# Patient Record
Sex: Female | Born: 1995 | Race: Black or African American | Hispanic: No | Marital: Married | Smoking: Never smoker
Health system: Southern US, Community
[De-identification: ages and names within clinical notes are randomized; demographics above are authoritative.]

## PROBLEM LIST (undated history)

## (undated) DIAGNOSIS — Z789 Other specified health status: Secondary | ICD-10-CM

---

## 2014-03-14 NOTE — L&D Delivery Note (Cosign Needed)
Delivery Note  Patient presented in labor and progressed without augmentation save for AROM when she was approximately 9 cm dilated.  Called to room as patient was completed dilated and pushing. During crowning, it was difficult to get good fetal heart readings. A fetal scalp electrode was placed and fetal HR was noted to be in the 50s. The patient was urged to push, and modified ritgen maneuver was utilized.   At 10:38 PM a viable female was delivered via Vaginal, Spontaneous Delivery (Presentation: ; Occiput Anterior).  APGAR: 8, 9; weight 3260 g.   Placenta status: Intact, Spontaneous.  Cord: 3 vessels with the following complications: None.  Cord pH: unable to obtain.  Complicated lacerations noted. Left through and through labial that was repaired with 4-0 vicryl. Bilateral sulcal and second degree perineal. The left sulcal laceration was noted to extend deeply into the vagina. Rectal exam performed noting no external anal sphincter or mucosal defect. The above were repaired with 3-0 vicryl. Vaginal packing placed with plan to remove in approximately 6-8 hours.  Anesthesia: Lidocaine, 2 doses of Fentanyl 100mcg Lacerations: see above Est. Blood Loss (mL):  400 mL  Mom to postpartum.  Baby to Couplet care / Skin to Skin.  Crystal Dorsey 02/01/2015, 12:12 AM Shonna ChockNoah Santiago Stenzel, MD OB/GYN Fellow

## 2014-07-15 ENCOUNTER — Other Ambulatory Visit (INDEPENDENT_AMBULATORY_CARE_PROVIDER_SITE_OTHER): Payer: Self-pay

## 2014-07-15 DIAGNOSIS — Z3481 Encounter for supervision of other normal pregnancy, first trimester: Secondary | ICD-10-CM

## 2014-07-15 NOTE — Progress Notes (Signed)
[facility] phlebotomist drew:  Prenatal panel, HIV, Sickle cell screen, OB urine culture 

## 2014-07-16 LAB — OBSTETRIC PANEL
Antibody Screen: NEGATIVE
BASOS ABS: 0 10*3/uL (ref 0.0–0.1)
Basophils Relative: 0 % (ref 0–1)
EOS ABS: 0 10*3/uL (ref 0.0–0.7)
EOS PCT: 1 % (ref 0–5)
HEMATOCRIT: 36.2 % (ref 36.0–46.0)
HEP B S AG: NEGATIVE
Hemoglobin: 12.1 g/dL (ref 12.0–15.0)
Lymphocytes Relative: 43 % (ref 12–46)
Lymphs Abs: 2 10*3/uL (ref 0.7–4.0)
MCH: 28 pg (ref 26.0–34.0)
MCHC: 33.4 g/dL (ref 30.0–36.0)
MCV: 83.8 fL (ref 78.0–100.0)
MONO ABS: 0.5 10*3/uL (ref 0.1–1.0)
MONOS PCT: 10 % (ref 3–12)
MPV: 10.3 fL (ref 8.6–12.4)
Neutro Abs: 2.2 10*3/uL (ref 1.7–7.7)
Neutrophils Relative %: 46 % (ref 43–77)
PLATELETS: 193 10*3/uL (ref 150–400)
RBC: 4.32 MIL/uL (ref 3.87–5.11)
RDW: 15 % (ref 11.5–15.5)
Rh Type: POSITIVE
Rubella: 5.95 Index — ABNORMAL HIGH (ref <0.90)
WBC: 4.7 10*3/uL (ref 4.0–10.5)

## 2014-07-16 LAB — HIV ANTIBODY (ROUTINE TESTING W REFLEX): HIV 1&2 Ab, 4th Generation: NONREACTIVE

## 2014-07-16 LAB — SICKLE CELL SCREEN: Sickle Cell Screen: NEGATIVE

## 2014-07-17 LAB — CULTURE, OB URINE
Colony Count: NO GROWTH
ORGANISM ID, BACTERIA: NO GROWTH

## 2014-07-22 ENCOUNTER — Encounter: Payer: Self-pay | Admitting: Family Medicine

## 2014-07-22 ENCOUNTER — Ambulatory Visit (INDEPENDENT_AMBULATORY_CARE_PROVIDER_SITE_OTHER): Payer: Self-pay | Admitting: Family Medicine

## 2014-07-22 VITALS — BP 98/66 | HR 92 | Temp 98.6°F | Ht 66.0 in | Wt 194.0 lb

## 2014-07-22 DIAGNOSIS — Z3401 Encounter for supervision of normal first pregnancy, first trimester: Secondary | ICD-10-CM

## 2014-07-22 DIAGNOSIS — Z3402 Encounter for supervision of normal first pregnancy, second trimester: Secondary | ICD-10-CM | POA: Insufficient documentation

## 2014-07-22 MED ORDER — PRENATAL 27-0.8 MG PO TABS: 1.0000 | ORAL_TABLET | Freq: Every day | ORAL | Status: DC

## 2014-07-22 NOTE — Progress Notes (Signed)
Wanda Matthews is a 19 y.o. yo G1P0 at [redacted]w[redacted]d who presents for her initial prenatal visit. Pregnancy  is notplanned, however the [name] and [name] appear excited She reports nausea and some food aversion. Able to tolerate PO She  isTaking PNV, however they are the vitafusion gummies with do not contain Fe. See flow sheet for details.  PMH, POBH, FH, meds, allergies and Social Hx reviewed. Specifically no PMHx, no meds or medication allergies  No alcohol, tobacco, or drug use Lives with sister, [name] is involved  No family h/o birth defects or DM  Prenatal exam:Gen: Well nourished, well developed.  No distress.  Vitals noted. Of note, pt's height is 5'6, making her BMI 31.3 HEENT: Normocephalic, atraumatic.  Neck supple without cervical lymphadenopathy, thyromegaly or thyroid nodules.  Good dentition. CV: RRR no murmur, gallops or rubs Lungs: CTA B.  Normal respiratory effort without wheezes or crackles. Abd: soft, NTND. +BS.  Uterus not appreciated above pelvis. GU: Normal external female genitalia without lesions.  Nl vaginal, well rugated without lesions. No vaginal discharge.  Bimanual exam: No adnexal mass or TTP. No CMT.   Ext: No clubbing, cyanosis or edema. Psych: Normal grooming and dress.  Not depressed or anxious appearing.  Normal thought content and process without flight of ideas or looseness of associations    Assessment/plan: 1) Pregnancy [redacted]w[redacted]d by certain LMP doing well.  Current pregnancy issues include obesity Dating is reliable Prenatal labs reviewed and unremarkable. Bleeding and pain precautions reviewed. Importance of prenatal vitamins reviewed (Rx for prenatal vitamins with Fe provided) Genetic screening offered: will get Quad screen at next appt Early glucola is indicated.  [facility]    Follow up 4 weeks. This appt was precepted with Dr. Mauricio PoBreen

## 2014-07-22 NOTE — Patient Instructions (Signed)
It was nice to meet you, Congratulations! Please make an appointment with Britta Mccreedy, our financial advisor, when you leave Please follow up with me in 1 month (at that time we will do a 1 hour glucose test to look for diabetes).  Medicines During Pregnancy During pregnancy, there are medicines that are either safe or unsafe to take. Medicines include prescriptions from your caregiver, over-the-counter medicines, topical creams applied to the skin, and all herbal substances. Medicines are put into either Class A, B, C, or D. Class A and B medicines have been shown to be safe in pregnancy. Class C medicines are also considered to be safe in pregnancy, but these medicines should only be used when necessary. Class D medicines should not be used at all in pregnancy. They can be harmful to a baby.  It is best to take as little medicine as possible while pregnant. However, some medicines are necessary to take for the mother and baby's health. Sometimes, it is more dangerous to stop taking certain medicines than to stay on them. This is often the case for people with long-term (chronic) conditions such as asthma, diabetes, or high blood pressure (hypertension). If you are pregnant and have a chronic illness, call your caregiver right away. Bring a list of your medicines and their doses to your appointments. If you are planning to become pregnant, schedule a doctor's appointment and discuss your medicines with your caregiver. Lastly, write down the phone number to your pharmacist. They can answer questions regarding a medicine's class and safety. They cannot give advice as to whether you should or should not be on a medicine.  SAFE AND UNSAFE MEDICINES There is a long list of medicines that are considered safe in pregnancy. Below is a shorter list. For specific medicines, ask your caregiver.  AllergyMedicines Loratadine, cetirizine, and chlorpheniramine are safe to take. Certain nasal steroid sprays are safe. Talk  to your caregiver about specific brands that are safe. Analgesics Acetaminophen and acetaminophen with codeine are safe to take. All other nonsteroidal anti-inflammatory drugs (NSAIDS) are not safe. This includes ibuprofen.  Antacids Many over-the-counter antacids are safe to take. Talk to your caregiver about specific brands that are safe. Famotidine, ranitidine, and lansoprazole are safe. Omepresole is considered safe to take in the second trimester. Antibiotic Medicines There are several antibiotics to avoid. These include, but are not limited to, tetracyline, quinolones, and sulfa medications. Talk to your caregiver before taking any antibiotic.  Antihistamines Talk to your caregiver about specific brands that are safe.  Asthma Medicines Most asthma steroid inhalers are safe to take. Talk to your caregiver for specific details. Calcium Calcium supplements are safe to take. Do not take oyster shell calcium.  Cough and Cold Medicines It is safe to take products with guaifenesin or dextromethorphan. Talk to your caregiver about specific brands that are safe. It is not safe to take products that contain aspirin or ibuprofen. Decongestant Medicines Pseudoephedrine-containing products are safe to take in the second and third trimester.  Depression Medicines Talk about these medicines with your caregiver.  Antidiarrheal Medicines It is safe to take loperamide. Talk to your caregiver about specific brands that are safe. It is not safe to take any antidiarrheal medicine that contains bismuth. Eyedrops Allergy eyedrops should be limited.  Iron It is safe to use certain iron-containing medicines for anemia in pregnancy. They require a prescription.  Antinausea Medicines It is safe to take doxylamine and vitamin B6 as directed. There are other prescription medicines available, if  needed.  Sleep aids It is safe to take diphenhydramine and acetaminophen with diphenhydramine.   Steroids Hydrocortisone creams are safe to use as directed. Oral steroids require a prescription. It is not safe to take any hemorrhoid cream with pramoxine or phenylephrine. Stool softener It is safe to take stool softener medicines. Avoid daily or prolonged use of stool softeners. Thyroid Medicine It is important to stay on this thyroid medicine. It needs to be followed by your caregiver.  Vaginal Medicines Your caregiver will prescribe a medicine to you if you have a vaginal infection. Certain antifungal medicines are safe to use if you have a sexually transmitted infection (STI). Talk to your caregiver.  Document Released: 02/28/2005 Document Revised: 05/23/2011 Document Reviewed: 03/01/2011 [facility] Patient Information 2015 [place], [place]. This information is not intended to replace advice given to you by your health care provider. Make sure you discuss any questions you have with your health care provider.  Second Trimester of Pregnancy The second trimester is from week 13 through week 28, month 4 through 6. This is often the time in pregnancy that you feel your best. Often times, morning sickness has lessened or quit. You may have more energy, and you may get hungry more often. Your unborn baby (fetus) is growing rapidly. At the end of the sixth month, he or she is about 9 inches long and weighs about 1 pounds. You will likely feel the baby move (quickening) between 18 and 20 weeks of pregnancy. HOME CARE   Avoid all smoking, herbs, and alcohol. Avoid drugs not approved by your doctor.  Only take medicine as told by your doctor. Some medicines are safe and some are not during pregnancy.  Exercise only as told by your doctor. Stop exercising if you start having cramps.  Eat regular, healthy meals.  Wear a good support bra if your breasts are tender.  Do not use hot tubs, steam rooms, or saunas.  Wear your seat belt when driving.  Avoid raw meat, uncooked cheese, and liter  boxes and soil used by cats.  Take your prenatal vitamins.  Try taking medicine that helps you poop (stool softener) as needed, and if your doctor approves. Eat more fiber by eating fresh fruit, vegetables, and whole grains. Drink enough fluids to keep your pee (urine) clear or pale yellow.  Take warm water baths (sitz baths) to soothe pain or discomfort caused by hemorrhoids. Use hemorrhoid cream if your doctor approves.  If you have puffy, bulging veins (varicose veins), wear support hose. Raise (elevate) your feet for 15 minutes, 3-4 times a day. Limit salt in your diet.  Avoid heavy lifting, wear low heals, and sit up straight.  Rest with your legs raised if you have leg cramps or low back pain.  Visit your dentist if you have not gone during your pregnancy. Use a soft toothbrush to brush your teeth. Be gentle when you floss.  You can have sex (intercourse) unless your doctor tells you not to.  Go to your doctor visits. GET HELP IF:   You feel dizzy.  You have mild cramps or pressure in your lower belly (abdomen).  You have a nagging pain in your belly area.  You continue to feel sick to your stomach (nauseous), throw up (vomit), or have watery poop (diarrhea).  You have bad smelling fluid coming from your vagina.  You have pain with peeing (urination). GET HELP RIGHT AWAY IF:   You have a fever.  You are leaking fluid from  your vagina.  You have spotting or bleeding from your vagina.  You have severe belly cramping or pain.  You lose or gain weight rapidly.  You have trouble catching your breath and have chest pain.  You notice sudden or extreme puffiness (swelling) of your face, hands, ankles, feet, or legs.  You have not felt the baby move in over an hour.  You have severe headaches that do not go away with medicine.  You have vision changes. Document Released: 05/25/2009 Document Revised: 06/25/2012 Document Reviewed: 05/01/2012 [facility] Patient  Information 2015 [place], [place]. This information is not intended to replace advice given to you by your health care provider. Make sure you discuss any questions you have with your health care provider.

## 2014-07-22 NOTE — Progress Notes (Signed)
I was preceptor the day of this visit.   

## 2014-07-23 ENCOUNTER — Encounter: Payer: Self-pay | Admitting: Family Medicine

## 2014-07-23 LAB — CERVICOVAGINAL ANCILLARY ONLY
Chlamydia: NEGATIVE
NEISSERIA GONORRHEA: NEGATIVE

## 2014-08-28 ENCOUNTER — Ambulatory Visit (INDEPENDENT_AMBULATORY_CARE_PROVIDER_SITE_OTHER): Payer: Self-pay | Admitting: Family Medicine

## 2014-08-28 VITALS — BP 110/61 | HR 98 | Temp 98.2°F | Wt 188.3 lb

## 2014-08-28 DIAGNOSIS — R7302 Impaired glucose tolerance (oral): Secondary | ICD-10-CM

## 2014-08-28 DIAGNOSIS — R7309 Other abnormal glucose: Secondary | ICD-10-CM

## 2014-08-28 DIAGNOSIS — Z3402 Encounter for supervision of normal first pregnancy, second trimester: Secondary | ICD-10-CM

## 2014-08-28 LAB — GLUCOSE, CAPILLARY: GLUCOSE-CAPILLARY: 146 mg/dL — AB (ref 65–99)

## 2014-08-28 NOTE — Assessment & Plan Note (Signed)
Patient to return for 3hr GTT.

## 2014-08-28 NOTE — Progress Notes (Signed)
Wanda Matthews is a 19 y.o. G1P0 at [redacted]w[redacted]d for routine follow up.  Wanda Matthews reports no complaints See flow sheet for details.  A/P: Pregnancy at [redacted]w[redacted]d.  Doing well.   Pregnancy issues include obesity Anatomy ultrasound ordered to be scheduled at 18-19 weeks with Dr. Gaynell Face (discussed up front fee with pt) Pt  is interested in genetic screening. Quad screen today/ Bleeding and pain precautions reviewed. Follow up 4 weeks.

## 2014-08-28 NOTE — Patient Instructions (Signed)
Second Trimester of Pregnancy The second trimester is from week 13 through week 28, months 4 through 6. The second trimester is often a time when you feel your best. Your body has also adjusted to being pregnant, and you begin to feel better physically. Usually, morning sickness has lessened or quit completely, you may have more energy, and you may have an increase in appetite. The second trimester is also a time when the fetus is growing rapidly. At the end of the sixth month, the fetus is about 9 inches long and weighs about 1 pounds. You will likely begin to feel the baby move (quickening) between 18 and 20 weeks of the pregnancy. BODY CHANGES Your body goes through many changes during pregnancy. The changes vary from woman to woman.   Your weight will continue to increase. You will notice your lower abdomen bulging out.  You may begin to get stretch marks on your hips, abdomen, and breasts.  You may develop headaches that can be relieved by medicines approved by your health care provider.  You may urinate more often because the fetus is pressing on your bladder.  You may develop or continue to have heartburn as a result of your pregnancy.  You may develop constipation because certain hormones are causing the muscles that push waste through your intestines to slow down.  You may develop hemorrhoids or swollen, bulging veins (varicose veins).  You may have back pain because of the weight gain and pregnancy hormones relaxing your joints between the bones in your pelvis and as a result of a shift in weight and the muscles that support your balance.  Your breasts will continue to grow and be tender.  Your gums may bleed and may be sensitive to brushing and flossing.  Dark spots or blotches (chloasma, mask of pregnancy) may develop on your face. This will likely fade after the baby is born.  A dark line from your belly button to the pubic area (linea nigra) may appear. This will likely fade  after the baby is born.  You may have changes in your hair. These can include thickening of your hair, rapid growth, and changes in texture. Some women also have hair loss during or after pregnancy, or hair that feels dry or thin. Your hair will most likely return to normal after your baby is born. WHAT TO EXPECT AT YOUR PRENATAL VISITS During a routine prenatal visit:  You will be weighed to make sure you and the fetus are growing normally.  Your blood pressure will be taken.  Your abdomen will be measured to track your baby's growth.  The fetal heartbeat will be listened to.  Any test results from the previous visit will be discussed. Your health care provider may ask you:  How you are feeling.  If you are feeling the baby move.  If you have had any abnormal symptoms, such as leaking fluid, bleeding, severe headaches, or abdominal cramping.  If you have any questions. Other tests that may be performed during your second trimester include:  Blood tests that check for:  Low iron levels (anemia).  Gestational diabetes (between 24 and 28 weeks).  Rh antibodies.  Urine tests to check for infections, diabetes, or protein in the urine.  An ultrasound to confirm the proper growth and development of the baby.  An amniocentesis to check for possible genetic problems.  Fetal screens for spina bifida and Down syndrome. HOME CARE INSTRUCTIONS   Avoid all smoking, herbs, alcohol, and unprescribed   drugs. These chemicals affect the formation and growth of the baby.  Follow your health care provider's instructions regarding medicine use. There are medicines that are either safe or unsafe to take during pregnancy.  Exercise only as directed by your health care provider. Experiencing uterine cramps is a good sign to stop exercising.  Continue to eat regular, healthy meals.  Wear a good support bra for breast tenderness.  Do not use hot tubs, steam rooms, or saunas.  Wear your  seat belt at all times when driving.  Avoid raw meat, uncooked cheese, cat litter boxes, and soil used by cats. These carry germs that can cause birth defects in the baby.  Take your prenatal vitamins.  Try taking a stool softener (if your health care provider approves) if you develop constipation. Eat more high-fiber foods, such as fresh vegetables or fruit and whole grains. Drink plenty of fluids to keep your urine clear or pale yellow.  Take warm sitz baths to soothe any pain or discomfort caused by hemorrhoids. Use hemorrhoid cream if your health care provider approves.  If you develop varicose veins, wear support hose. Elevate your feet for 15 minutes, 3-4 times a day. Limit salt in your diet.  Avoid heavy lifting, wear low heel shoes, and practice good posture.  Rest with your legs elevated if you have leg cramps or low back pain.  Visit your dentist if you have not gone yet during your pregnancy. Use a soft toothbrush to brush your teeth and be gentle when you floss.  A sexual relationship may be continued unless your health care provider directs you otherwise.  Continue to go to all your prenatal visits as directed by your health care provider. SEEK MEDICAL CARE IF:   You have dizziness.  You have mild pelvic cramps, pelvic pressure, or nagging pain in the abdominal area.  You have persistent nausea, vomiting, or diarrhea.  You have a bad smelling vaginal discharge.  You have pain with urination. SEEK IMMEDIATE MEDICAL CARE IF:   You have a fever.  You are leaking fluid from your vagina.  You have spotting or bleeding from your vagina.  You have severe abdominal cramping or pain.  You have rapid weight gain or loss.  You have shortness of breath with chest pain.  You notice sudden or extreme swelling of your face, hands, ankles, feet, or legs.  You have not felt your baby move in over an hour.  You have severe headaches that do not go away with  medicine.  You have vision changes. Document Released: 02/22/2001 Document Revised: 03/05/2013 Document Reviewed: 05/01/2012 Exit[employer] Patient Information 2015 [employer], [employer]. This information is not intended to replace advice given to you by your health care provider. Make sure you discuss any questions you have with your health care provider.  

## 2014-08-29 ENCOUNTER — Telehealth: Payer: Self-pay | Admitting: Family Medicine

## 2014-08-29 NOTE — Telephone Encounter (Signed)
Attempted to call patient twice. No answer & no VM. Sending a letter with ultrasound appt info included.   6/24 @ 10:30am at Dr. Gaynell Face office.

## 2014-09-01 ENCOUNTER — Other Ambulatory Visit: Payer: Self-pay | Admitting: *Deleted

## 2014-09-01 DIAGNOSIS — Z3402 Encounter for supervision of normal first pregnancy, second trimester: Secondary | ICD-10-CM

## 2014-09-02 ENCOUNTER — Other Ambulatory Visit: Payer: Self-pay

## 2014-09-04 ENCOUNTER — Other Ambulatory Visit: Payer: Self-pay

## 2014-09-04 NOTE — Progress Notes (Signed)
AFP drawn today. Will be picked up tomorrow and performed at [facility].[place], Rodena Medin

## 2014-09-09 ENCOUNTER — Telehealth: Payer: Self-pay | Admitting: *Deleted

## 2014-09-09 ENCOUNTER — Other Ambulatory Visit: Payer: Self-pay

## 2014-09-09 DIAGNOSIS — R7309 Other abnormal glucose: Secondary | ICD-10-CM

## 2014-09-09 NOTE — Telephone Encounter (Signed)
Called patient x2, line rings then goes to busy signal. Will try again later.

## 2014-09-09 NOTE — Telephone Encounter (Signed)
Patient requesting to speak with MD, states her stomach will not tolerate and is refusing 3 hour GTT. Will forward to PCP.

## 2014-09-09 NOTE — Telephone Encounter (Signed)
-----   Message from Joanna Puffrystal S Dorsey, MD sent at 09/08/2014 10:00 AM EDT ----- Please call the patient and have her schedule a 3hr GTT appt in our clinic. Since she vomited during her last one we need to repeat it. If she can't doing it our clinic, [facility].   Thanks, Joanna Puffrystal S. Dorsey, MD [facility] Resident  09/08/2014, 10:01 AM

## 2014-09-11 NOTE — Telephone Encounter (Signed)
Patient refusing to do 3 hr GTT. States she believes her sugar was high at last visit due to eating heavily. Amenable to repeat 1h GTT. Will come into clinic for this test (anytime before 3:45pm).   Wanda Puffrystal S. Versa Craton, MD [facility] Resident  09/11/2014, 11:48 AM

## 2014-09-23 ENCOUNTER — Ambulatory Visit: Payer: Self-pay

## 2014-09-23 ENCOUNTER — Encounter: Payer: Self-pay | Admitting: *Deleted

## 2014-10-09 ENCOUNTER — Ambulatory Visit (INDEPENDENT_AMBULATORY_CARE_PROVIDER_SITE_OTHER): Payer: Self-pay | Admitting: Family Medicine

## 2014-10-09 VITALS — BP 102/63 | HR 88 | Temp 98.1°F | Wt 188.0 lb

## 2014-10-09 DIAGNOSIS — Z3402 Encounter for supervision of normal first pregnancy, second trimester: Secondary | ICD-10-CM

## 2014-10-09 LAB — GLUCOSE, CAPILLARY: Glucose-Capillary: 116 mg/dL — ABNORMAL HIGH (ref 65–99)

## 2014-10-09 NOTE — Patient Instructions (Signed)
Follow up in 1 month for routine OB care with Dr Leonides Schanz.  Preterm Labor Information Preterm labor is when labor starts before you are [redacted] weeks pregnant. The normal length of pregnancy is 39 to 41 weeks.  CAUSES  The cause of preterm labor is not often known. The most common known cause is infection. RISK FACTORS  Having a history of preterm labor.  Having your water break before it should.  Having a placenta that covers the opening of the cervix.  Having a placenta that breaks away from the uterus.  Having a cervix that is too weak to hold the baby in the uterus.  Having too much fluid in the amniotic sac.  Taking drugs or smoking while pregnant.  Not gaining enough weight while pregnant.  Being younger than 73 and older than 20 years old.  Having a low income.  Being African American. SYMPTOMS  Period-like cramps, belly (abdominal) pain, or back pain.  Contractions that are regular, as often as six in an hour. They may be mild or painful.  Contractions that start at the top of the belly. They then move to the lower belly and back.  Lower belly pressure that seems to get stronger.  Bleeding from the vagina.  Fluid leaking from the vagina. TREATMENT  Treatment depends on:  Your condition.  The condition of your baby.  How many weeks pregnant you are. Your doctor may have you:  Take medicine to stop contractions.  Stay in bed except to use the restroom (bed rest).  Stay in the hospital. WHAT SHOULD YOU DO IF YOU THINK YOU ARE IN PRETERM LABOR? Call your doctor right away. You need to go to the hospital right away.  HOW CAN YOU PREVENT PRETERM LABOR IN FUTURE PREGNANCIES?  Stop smoking, if you smoke.  Maintain healthy weight gain.  Do not take drugs or be around chemicals that are not needed.  Tell your doctor if you think you have an infection.  Tell your doctor if you had a preterm labor before. Document Released: 05/27/2008 Document Revised:  12/19/2012 Document Reviewed: 05/27/2008 [facility] Patient Information 2015 [place], [place]. This information is not intended to replace advice given to you by your health care provider. Make sure you discuss any questions you have with your health care provider. Second Trimester of Pregnancy The second trimester is from week 13 through week 28, months 4 through 6. The second trimester is often a time when you feel your best. Your body has also adjusted to being pregnant, and you begin to feel better physically. Usually, morning sickness has lessened or quit completely, you may have more energy, and you may have an increase in appetite. The second trimester is also a time when the fetus is growing rapidly. At the end of the sixth month, the fetus is about 9 inches long and weighs about 1 pounds. You will likely begin to feel the baby move (quickening) between 18 and 20 weeks of the pregnancy. BODY CHANGES Your body goes through many changes during pregnancy. The changes vary from woman to woman.   Your weight will continue to increase. You will notice your lower abdomen bulging out.  You may begin to get stretch marks on your hips, abdomen, and breasts.  You may develop headaches that can be relieved by medicines approved by your health care provider.  You may urinate more often because the fetus is pressing on your bladder.  You may develop or continue to have heartburn as a  result of your pregnancy.  You may develop constipation because certain hormones are causing the muscles that push waste through your intestines to slow down.  You may develop hemorrhoids or swollen, bulging veins (varicose veins).  You may have back pain because of the weight gain and pregnancy hormones relaxing your joints between the bones in your pelvis and as a result of a shift in weight and the muscles that support your balance.  Your breasts will continue to grow and be tender.  Your gums may bleed and may be  sensitive to brushing and flossing.  Dark spots or blotches (chloasma, mask of pregnancy) may develop on your face. This will likely fade after the baby is born.  A dark line from your belly button to the pubic area (linea nigra) may appear. This will likely fade after the baby is born.  You may have changes in your hair. These can include thickening of your hair, rapid growth, and changes in texture. Some women also have hair loss during or after pregnancy, or hair that feels dry or thin. Your hair will most likely return to normal after your baby is born. WHAT TO EXPECT AT YOUR PRENATAL VISITS During a routine prenatal visit:  You will be weighed to make sure you and the fetus are growing normally.  Your blood pressure will be taken.  Your abdomen will be measured to track your baby's growth.  The fetal heartbeat will be listened to.  Any test results from the previous visit will be discussed. Your health care provider may ask you:  How you are feeling.  If you are feeling the baby move.  If you have had any abnormal symptoms, such as leaking fluid, bleeding, severe headaches, or abdominal cramping.  If you have any questions. Other tests that may be performed during your second trimester include:  Blood tests that check for:  Low iron levels (anemia).  Gestational diabetes (between 24 and 28 weeks).  Rh antibodies.  Urine tests to check for infections, diabetes, or protein in the urine.  An ultrasound to confirm the proper growth and development of the baby.  An amniocentesis to check for possible genetic problems.  Fetal screens for spina bifida and Down syndrome. HOME CARE INSTRUCTIONS   Avoid all smoking, herbs, alcohol, and unprescribed drugs. These chemicals affect the formation and growth of the baby.  Follow your health care provider's instructions regarding medicine use. There are medicines that are either safe or unsafe to take during  pregnancy.  Exercise only as directed by your health care provider. Experiencing uterine cramps is a good sign to stop exercising.  Continue to eat regular, healthy meals.  Wear a good support bra for breast tenderness.  Do not use hot tubs, steam rooms, or saunas.  Wear your seat belt at all times when driving.  Avoid raw meat, uncooked cheese, cat litter boxes, and soil used by cats. These carry germs that can cause birth defects in the baby.  Take your prenatal vitamins.  Try taking a stool softener (if your health care provider approves) if you develop constipation. Eat more high-fiber foods, such as fresh vegetables or fruit and whole grains. Drink plenty of fluids to keep your urine clear or pale yellow.  Take warm sitz baths to soothe any pain or discomfort caused by hemorrhoids. Use hemorrhoid cream if your health care provider approves.  If you develop varicose veins, wear support hose. Elevate your feet for 15 minutes, 3-4 times a day. Limit  salt in your diet.  Avoid heavy lifting, wear low heel shoes, and practice good posture.  Rest with your legs elevated if you have leg cramps or low back pain.  Visit your dentist if you have not gone yet during your pregnancy. Use a soft toothbrush to brush your teeth and be gentle when you floss.  A sexual relationship may be continued unless your health care provider directs you otherwise.  Continue to go to all your prenatal visits as directed by your health care provider. SEEK MEDICAL CARE IF:   You have dizziness.  You have mild pelvic cramps, pelvic pressure, or nagging pain in the abdominal area.  You have persistent nausea, vomiting, or diarrhea.  You have a bad smelling vaginal discharge.  You have pain with urination. SEEK IMMEDIATE MEDICAL CARE IF:   You have a fever.  You are leaking fluid from your vagina.  You have spotting or bleeding from your vagina.  You have severe abdominal cramping or pain.  You  have rapid weight gain or loss.  You have shortness of breath with chest pain.  You notice sudden or extreme swelling of your face, hands, ankles, feet, or legs.  You have not felt your baby move in over an hour.  You have severe headaches that do not go away with medicine.  You have vision changes. Document Released: 02/22/2001 Document Revised: 03/05/2013 Document Reviewed: 05/01/2012 [facility] Patient Information 2015 [place], [place]. This information is not intended to replace advice given to you by your health care provider. Make sure you discuss any questions you have with your health care provider.

## 2014-10-09 NOTE — Progress Notes (Signed)
Wanda Matthews is a 20 y.o. G1P0 at [redacted]w[redacted]d for routine follow up.  She reports that she is doing well.  She did get u/s done with Dr Gaynell Face.  Everything was normal but notes baby was measuring 1 week ahead, but was told this will not affect due date.  She denies LOF, vaginal bleeding, abnormal vaginal discharge, nausea, vomiting, edema.  +FM.  She is eating and drinking sufficiently.  See flow sheet for details.  A/P: Pregnancy at [redacted]w[redacted]d.  Doing well.   Pregnancy issues include obesity and  failed 1 hour GTT.  This was repeated today.  Will follow up results. Quad screen is NEGATIVE.  See Media tab for results. Anatomy scan obtained per patient w/ Dr Gaynell Face.  Will call today to obtain these results.  Plan to discuss at next appointment Continue PNV. Preterm labor precautions reviewed.  Discussed availability of MAU if needed. Follow up 4 weeks.

## 2014-10-10 ENCOUNTER — Telehealth: Payer: Self-pay | Admitting: Family Medicine

## 2014-10-10 NOTE — Telephone Encounter (Signed)
Calling to find out if we received an u/s that was faxed three times yesterday, please call back to confirm, she is also putting a copy in the mail just in case

## 2014-10-13 NOTE — Telephone Encounter (Signed)
Called to let pt know we have U/S. Everything looks normal. No answer. If pt calls back please let her know.

## 2014-10-28 ENCOUNTER — Encounter: Payer: Self-pay | Admitting: Family Medicine

## 2014-11-06 ENCOUNTER — Encounter: Payer: Self-pay | Admitting: Family Medicine

## 2014-11-10 ENCOUNTER — Ambulatory Visit (INDEPENDENT_AMBULATORY_CARE_PROVIDER_SITE_OTHER): Payer: Medicaid Other | Admitting: Family Medicine

## 2014-11-10 VITALS — BP 112/62 | HR 104 | Temp 98.3°F | Wt 195.1 lb

## 2014-11-10 DIAGNOSIS — Z23 Encounter for immunization: Secondary | ICD-10-CM

## 2014-11-10 DIAGNOSIS — Z3402 Encounter for supervision of normal first pregnancy, second trimester: Secondary | ICD-10-CM

## 2014-11-10 LAB — CBC WITH DIFFERENTIAL/PLATELET
BASOS ABS: 0 10*3/uL (ref 0.0–0.1)
Basophils Relative: 0 % (ref 0–1)
EOS PCT: 1 % (ref 0–5)
Eosinophils Absolute: 0.1 10*3/uL (ref 0.0–0.7)
HEMATOCRIT: 36.6 % (ref 36.0–46.0)
Hemoglobin: 12.4 g/dL (ref 12.0–15.0)
LYMPHS ABS: 2 10*3/uL (ref 0.7–4.0)
LYMPHS PCT: 31 % (ref 12–46)
MCH: 30.2 pg (ref 26.0–34.0)
MCHC: 33.9 g/dL (ref 30.0–36.0)
MCV: 89.3 fL (ref 78.0–100.0)
MPV: 11.3 fL (ref 8.6–12.4)
Monocytes Absolute: 0.7 10*3/uL (ref 0.1–1.0)
Monocytes Relative: 10 % (ref 3–12)
NEUTROS PCT: 58 % (ref 43–77)
Neutro Abs: 3.8 10*3/uL (ref 1.7–7.7)
Platelets: 197 10*3/uL (ref 150–400)
RBC: 4.1 MIL/uL (ref 3.87–5.11)
RDW: 14.7 % (ref 11.5–15.5)
WBC: 6.6 10*3/uL (ref 4.0–10.5)

## 2014-11-10 NOTE — Progress Notes (Signed)
Wanda Matthews is a 19 y.o. G1P0 at [redacted]w[redacted]d for routine follow up.  She reports no LOF, vaginal bleeding, contractions. Does note some round ligament pain that comes on with prolonged standing and improves with rest.  See flow sheet for details.  A/P: Pregnancy at [redacted]w[redacted]d.  Doing well.   Pregnancy issues include   Infant feeding choice: breastfeeding with formula supplementation as needed  Contraception choice: Unsure, considering nexplanon  Infant circumcision desired not applicable  Tdapwas given today. 1 hour glucola, CBC, RPR, and HIV were done today.   Pregnancy medical home forms were not done today and reviewed.   RH status was reviewed and pt does not need Rhogam.  Rhogam was not given today.   Childbirth and education classes were offered. Preterm labor precautions reviewed. Kick counts reviewed. Follow up 2 weeks.

## 2014-11-10 NOTE — Patient Instructions (Addendum)
Braxton Hicks Contractions Contractions of the uterus can occur throughout pregnancy. Contractions are not always a sign that you are in labor.  WHAT ARE BRAXTON HICKS CONTRACTIONS?  Contractions that occur before labor are called Braxton Hicks contractions, or false labor. Toward the end of pregnancy (32-34 weeks), these contractions can develop more often and may become more forceful. This is not true labor because these contractions do not result in opening (dilatation) and thinning of the cervix. They are sometimes difficult to tell apart from true labor because these contractions can be forceful and people have different pain tolerances. You should not feel embarrassed if you go to the hospital with false labor. Sometimes, the only way to tell if you are in true labor is for your health care provider to look for changes in the cervix. If there are no prenatal problems or other health problems associated with the pregnancy, it is completely safe to be sent home with false labor and await the onset of true labor. HOW CAN YOU TELL THE DIFFERENCE BETWEEN TRUE AND FALSE LABOR? False Labor  The contractions of false labor are usually shorter and not as hard as those of true labor.   The contractions are usually irregular.   The contractions are often felt in the front of the lower abdomen and in the groin.   The contractions may go away when you walk around or change positions while lying down.   The contractions get weaker and are shorter lasting as time goes on.   The contractions do not usually become progressively stronger, regular, and closer together as with true labor.  True Labor  Contractions in true labor last 30-70 seconds, become very regular, usually become more intense, and increase in frequency.   The contractions do not go away with walking.   The discomfort is usually felt in the top of the uterus and spreads to the lower abdomen and low back.   True labor can be  determined by your health care provider with an exam. This will show that the cervix is dilating and getting thinner.  WHAT TO REMEMBER  Keep up with your usual exercises and follow other instructions given by your health care provider.   Take medicines as directed by your health care provider.   Keep your regular prenatal appointments.   Eat and drink lightly if you think you are going into labor.   If Braxton Hicks contractions are making you uncomfortable:   Change your position from lying down or resting to walking, or from walking to resting.   Sit and rest in a tub of warm water.   Drink 2-3 glasses of water. Dehydration may cause these contractions.   Do slow and deep breathing several times an hour.  WHEN SHOULD I SEEK IMMEDIATE MEDICAL CARE? Seek immediate medical care if:  Your contractions become stronger, more regular, and closer together.   You have fluid leaking or gushing from your vagina.   You have a fever.   You pass blood-tinged mucus.   You have vaginal bleeding.   You have continuous abdominal pain.   You have low back pain that you never had before.   You feel your baby's head pushing down and causing pelvic pressure.   Your baby is not moving as much as it used to.  Document Released: 02/28/2005 Document Revised: 03/05/2013 Document Reviewed: 12/10/2012 [employer] Patient Information 2015 [employer]. This information is not intended to replace advice given to you by your health care   provider. Make sure you discuss any questions you have with your health care provider. °Third Trimester of Pregnancy °The third trimester is from week 29 through week 42, months 7 through 9. The third trimester is a time when the fetus is growing rapidly. At the end of the ninth month, the fetus is about 20 inches in length and weighs 6-10 pounds.  °BODY CHANGES °Your body goes through many changes during pregnancy. The changes vary from woman to woman.   °· Your weight will continue to increase. You can expect to gain 25-35 pounds (11-16 kg) by the end of the pregnancy. °· You may begin to get stretch marks on your hips, abdomen, and breasts. °· You may urinate more often because the fetus is moving lower into your pelvis and pressing on your bladder. °· You may develop or continue to have heartburn as a result of your pregnancy. °· You may develop constipation because certain hormones are causing the muscles that push waste through your intestines to slow down. °· You may develop hemorrhoids or swollen, bulging veins (varicose veins). °· You may have pelvic pain because of the weight gain and pregnancy hormones relaxing your joints between the bones in your pelvis. Backaches may result from overexertion of the muscles supporting your posture. °· You may have changes in your hair. These can include thickening of your hair, rapid growth, and changes in texture. Some women also have hair loss during or after pregnancy, or hair that feels dry or thin. Your hair will most likely return to normal after your baby is born. °· Your breasts will continue to grow and be tender. A yellow discharge may leak from your breasts called colostrum. °· Your belly button may stick out. °· You may feel short of breath because of your expanding uterus. °· You may notice the fetus "dropping," or moving lower in your abdomen. °· You may have a bloody mucus discharge. This usually occurs a few days to a week before labor begins. °· Your cervix becomes thin and soft (effaced) near your due date. °WHAT TO EXPECT AT YOUR PRENATAL EXAMS  °You will have prenatal exams every 2 weeks until week 36. Then, you will have weekly prenatal exams. During a routine prenatal visit: °· You will be weighed to make sure you and the fetus are growing normally. °· Your blood pressure is taken. °· Your abdomen will be measured to track your baby's growth. °· The fetal heartbeat will be listened to. °· Any test  results from the previous visit will be discussed. °· You may have a cervical check near your due date to see if you have effaced. °At around 36 weeks, your caregiver will check your cervix. At the same time, your caregiver will also perform a test on the secretions of the vaginal tissue. This test is to determine if a type of bacteria, Group B streptococcus, is present. Your caregiver will explain this further. °Your caregiver may ask you: °· What your birth plan is. °· How you are feeling. °· If you are feeling the baby move. °· If you have had any abnormal symptoms, such as leaking fluid, bleeding, severe headaches, or abdominal cramping. °· If you have any questions. °Other tests or screenings that may be performed during your third trimester include: °· Blood tests that check for low iron levels (anemia). °· Fetal testing to check the health, activity level, and growth of the fetus. Testing is done if you have certain medical conditions or if   there are problems during the pregnancy. °FALSE LABOR °You may feel small, irregular contractions that eventually go away. These are called Braxton Hicks contractions, or false labor. Contractions may last for hours, days, or even weeks before true labor sets in. If contractions come at regular intervals, intensify, or become painful, it is best to be seen by your caregiver.  °SIGNS OF LABOR  °· Menstrual-like cramps. °· Contractions that are 5 minutes apart or less. °· Contractions that start on the top of the uterus and spread down to the lower abdomen and back. °· A sense of increased pelvic pressure or back pain. °· A watery or bloody mucus discharge that comes from the vagina. °If you have any of these signs before the 37th week of pregnancy, call your caregiver right away. You need to go to the hospital to get checked immediately. °HOME CARE INSTRUCTIONS  °· Avoid all smoking, herbs, alcohol, and unprescribed drugs. These chemicals affect the formation and growth of  the baby. °· Follow your caregiver's instructions regarding medicine use. There are medicines that are either safe or unsafe to take during pregnancy. °· Exercise only as directed by your caregiver. Experiencing uterine cramps is a good sign to stop exercising. °· Continue to eat regular, healthy meals. °· Wear a good support bra for breast tenderness. °· Do not use hot tubs, steam rooms, or saunas. °· Wear your seat belt at all times when driving. °· Avoid raw meat, uncooked cheese, cat litter boxes, and soil used by cats. These carry germs that can cause birth defects in the baby. °· Take your prenatal vitamins. °· Try taking a stool softener (if your caregiver approves) if you develop constipation. Eat more high-fiber foods, such as fresh vegetables or fruit and whole grains. Drink plenty of fluids to keep your urine clear or pale yellow. °· Take warm sitz baths to soothe any pain or discomfort caused by hemorrhoids. Use hemorrhoid cream if your caregiver approves. °· If you develop varicose veins, wear support hose. Elevate your feet for 15 minutes, 3-4 times a day. Limit salt in your diet. °· Avoid heavy lifting, wear low heal shoes, and practice good posture. °· Rest a lot with your legs elevated if you have leg cramps or low back pain. °· Visit your dentist if you have not gone during your pregnancy. Use a soft toothbrush to brush your teeth and be gentle when you floss. °· A sexual relationship may be continued unless your caregiver directs you otherwise. °· Do not travel far distances unless it is absolutely necessary and only with the approval of your caregiver. °· Take prenatal classes to understand, practice, and ask questions about the labor and delivery. °· Make a trial run to the hospital. °· Pack your hospital bag. °· Prepare the baby's nursery. °· Continue to go to all your prenatal visits as directed by your caregiver. °SEEK MEDICAL CARE IF: °· You are unsure if you are in labor or if your water  has broken. °· You have dizziness. °· You have mild pelvic cramps, pelvic pressure, or nagging pain in your abdominal area. °· You have persistent nausea, vomiting, or diarrhea. °· You have a bad smelling vaginal discharge. °· You have pain with urination. °SEEK IMMEDIATE MEDICAL CARE IF:  °· You have a fever. °· You are leaking fluid from your vagina. °· You have spotting or bleeding from your vagina. °· You have severe abdominal cramping or pain. °· You have rapid weight loss or gain. °·   You have shortness of breath with chest pain. °· You notice sudden or extreme swelling of your face, hands, ankles, feet, or legs. °· You have not felt your baby move in over an hour. °· You have severe headaches that do not go away with medicine. °· You have vision changes. °Document Released: 02/22/2001 Document Revised: 03/05/2013 Document Reviewed: 05/01/2012 °[employer]® Patient Information ©2015 [employer]. This information is not intended to replace advice given to you by your health care provider. Make sure you discuss any questions you have with your health care provider. ° °

## 2014-11-10 NOTE — Addendum Note (Signed)
Addended by: Lamonte Sakai, APRIL D on: 11/10/2014 02:47 PM   Modules accepted: Orders

## 2014-11-11 LAB — RPR

## 2014-11-11 LAB — HIV ANTIBODY (ROUTINE TESTING W REFLEX): HIV 1&2 Ab, 4th Generation: NONREACTIVE

## 2014-11-25 ENCOUNTER — Ambulatory Visit (INDEPENDENT_AMBULATORY_CARE_PROVIDER_SITE_OTHER): Payer: Self-pay | Admitting: Family Medicine

## 2014-11-25 VITALS — BP 94/66 | HR 100 | Temp 98.4°F | Wt 201.4 lb

## 2014-11-25 DIAGNOSIS — Z3403 Encounter for supervision of normal first pregnancy, third trimester: Secondary | ICD-10-CM

## 2014-11-25 NOTE — Patient Instructions (Signed)
If you have any cramping/contractions, vaginal bleeding, fluid leaking, or are worried that baby is not moving normally, go immediately to [facility] to be evaluated.   Third Trimester of Pregnancy The third trimester is from week 29 through week 42, months 7 through 9. The third trimester is a time when the fetus is growing rapidly. At the end of the ninth month, the fetus is about 20 inches in length and weighs 6-10 pounds.  BODY CHANGES Your body goes through many changes during pregnancy. The changes vary from woman to woman.   Your weight will continue to increase. You can expect to gain 25-35 pounds (11-16 kg) by the end of the pregnancy.  You may begin to get stretch marks on your hips, abdomen, and breasts.  You may urinate more often because the fetus is moving lower into your pelvis and pressing on your bladder.  You may develop or continue to have heartburn as a result of your pregnancy.  You may develop constipation because certain hormones are causing the muscles that push waste through your intestines to slow down.  You may develop hemorrhoids or swollen, bulging veins (varicose veins).  You may have pelvic pain because of the weight gain and pregnancy hormones relaxing your joints between the bones in your pelvis. Backaches may result from overexertion of the muscles supporting your posture.  You may have changes in your hair. These can include thickening of your hair, rapid growth, and changes in texture. Some women also have hair loss during or after pregnancy, or hair that feels dry or thin. Your hair will most likely return to normal after your baby is born.  Your breasts will continue to grow and be tender. A yellow discharge may leak from your breasts called colostrum.  Your belly button may stick out.  You may feel short of breath because of your expanding uterus.  You may notice the fetus "dropping," or moving lower in your abdomen.  You may have a bloody  mucus discharge. This usually occurs a few days to a week before labor begins.  Your cervix becomes thin and soft (effaced) near your due date. WHAT TO EXPECT AT YOUR PRENATAL EXAMS  You will have prenatal exams every 2 weeks until week 36. Then, you will have weekly prenatal exams. During a routine prenatal visit:  You will be weighed to make sure you and the fetus are growing normally.  Your blood pressure is taken.  Your abdomen will be measured to track your baby's growth.  The fetal heartbeat will be listened to.  Any test results from the previous visit will be discussed.  You may have a cervical check near your due date to see if you have effaced. At around 36 weeks, your caregiver will check your cervix. At the same time, your caregiver will also perform a test on the secretions of the vaginal tissue. This test is to determine if a type of bacteria, Group B streptococcus, is present. Your caregiver will explain this further. Your caregiver may ask you:  What your birth plan is.  How you are feeling.  If you are feeling the baby move.  If you have had any abnormal symptoms, such as leaking fluid, bleeding, severe headaches, or abdominal cramping.  If you have any questions. Other tests or screenings that may be performed during your third trimester include:  Blood tests that check for low iron levels (anemia).  Fetal testing to check the health, activity level, and growth of   the fetus. Testing is done if you have certain medical conditions or if there are problems during the pregnancy. FALSE LABOR You may feel small, irregular contractions that eventually go away. These are called Braxton Hicks contractions, or false labor. Contractions may last for hours, days, or even weeks before true labor sets in. If contractions come at regular intervals, intensify, or become painful, it is best to be seen by your caregiver.  SIGNS OF LABOR   Menstrual-like cramps.  Contractions  that are 5 minutes apart or less.  Contractions that start on the top of the uterus and spread down to the lower abdomen and back.  A sense of increased pelvic pressure or back pain.  A watery or bloody mucus discharge that comes from the vagina. If you have any of these signs before the 37th week of pregnancy, call your caregiver right away. You need to go to the hospital to get checked immediately. HOME CARE INSTRUCTIONS   Avoid all smoking, herbs, alcohol, and unprescribed drugs. These chemicals affect the formation and growth of the baby.  Follow your caregiver's instructions regarding medicine use. There are medicines that are either safe or unsafe to take during pregnancy.  Exercise only as directed by your caregiver. Experiencing uterine cramps is a good sign to stop exercising.  Continue to eat regular, healthy meals.  Wear a good support bra for breast tenderness.  Do not use hot tubs, steam rooms, or saunas.  Wear your seat belt at all times when driving.  Avoid raw meat, uncooked cheese, cat litter boxes, and soil used by cats. These carry germs that can cause birth defects in the baby.  Take your prenatal vitamins.  Try taking a stool softener (if your caregiver approves) if you develop constipation. Eat more high-fiber foods, such as fresh vegetables or fruit and whole grains. Drink plenty of fluids to keep your urine clear or pale yellow.  Take warm sitz baths to soothe any pain or discomfort caused by hemorrhoids. Use hemorrhoid cream if your caregiver approves.  If you develop varicose veins, wear support hose. Elevate your feet for 15 minutes, 3-4 times a day. Limit salt in your diet.  Avoid heavy lifting, wear low heal shoes, and practice good posture.  Rest a lot with your legs elevated if you have leg cramps or low back pain.  Visit your dentist if you have not gone during your pregnancy. Use a soft toothbrush to brush your teeth and be gentle when you  floss.  A sexual relationship may be continued unless your caregiver directs you otherwise.  Do not travel far distances unless it is absolutely necessary and only with the approval of your caregiver.  Take prenatal classes to understand, practice, and ask questions about the labor and delivery.  Make a trial run to the hospital.  Pack your hospital bag.  Prepare the baby's nursery.  Continue to go to all your prenatal visits as directed by your caregiver. SEEK MEDICAL CARE IF:  You are unsure if you are in labor or if your water has broken.  You have dizziness.  You have mild pelvic cramps, pelvic pressure, or nagging pain in your abdominal area.  You have persistent nausea, vomiting, or diarrhea.  You have a bad smelling vaginal discharge.  You have pain with urination. SEEK IMMEDIATE MEDICAL CARE IF:   You have a fever.  You are leaking fluid from your vagina.  You have spotting or bleeding from your vagina.  You have severe  abdominal cramping or pain.  You have rapid weight loss or gain.  You have shortness of breath with chest pain.  You notice sudden or extreme swelling of your face, hands, ankles, feet, or legs.  You have not felt your baby move in over an hour.  You have severe headaches that do not go away with medicine.  You have vision changes. Document Released: 02/22/2001 Document Revised: 03/05/2013 Document Reviewed: 05/01/2012 [employer] Patient Information 2015 [employer]. This information is not intended to replace advice given to you by your health care provider. Make sure you discuss any questions you have with your health care provider.  

## 2014-11-27 NOTE — Progress Notes (Signed)
Wanda Matthews is a 19 y.o. G1P0 at [redacted]w[redacted]d for routine follow up.  She reports she's doing well. Feels some fluttering in stomach, wonders if it's baby kicking. No decreased fetal movement. Denies cramping/ctx, fluid leaking, vaginal bleeding, or decreased fetal movement.    See flow sheet for details.  A/P: Pregnancy at [redacted]w[redacted]d.  Doing well.   Pregnancy issues include none Contraception choice nexplanon Labs reviewed from last visit - unremarkable. Based on chart review, pt got u/s done at Dr. Elsie Stain office, but no result visible in Epic. Will message PCP to see if results have been obtained. Preterm labor & fetal movement precautions reviewed. Follow up 2 weeks.

## 2014-12-11 ENCOUNTER — Ambulatory Visit (INDEPENDENT_AMBULATORY_CARE_PROVIDER_SITE_OTHER): Payer: Self-pay | Admitting: Family Medicine

## 2014-12-11 ENCOUNTER — Ambulatory Visit (INDEPENDENT_AMBULATORY_CARE_PROVIDER_SITE_OTHER): Payer: Self-pay | Admitting: Pediatrics

## 2014-12-11 VITALS — BP 111/59 | HR 100 | Temp 98.3°F | Wt 203.0 lb

## 2014-12-11 DIAGNOSIS — Z3493 Encounter for supervision of normal pregnancy, unspecified, third trimester: Secondary | ICD-10-CM

## 2014-12-11 DIAGNOSIS — Z7681 Expectant parent(s) prebirth pediatrician visit: Secondary | ICD-10-CM

## 2014-12-11 NOTE — Patient Instructions (Signed)
It was nice seeing you today. Your pregnancy is progressing well. Please continue prenatal vitamin, ensure you get your flu shot at the pharmacy. Follow up in 2wks with PCP. Call if you have any question.   Prenatal Care  WHAT IS PRENATAL CARE?  Prenatal care means health care during your pregnancy, before your baby is born. It is very important to take care of yourself and your baby during your pregnancy by:   Getting early prenatal care. If you know you are pregnant, or think you might be pregnant, call your health care provider as soon as possible. Schedule a visit for a prenatal exam.  Getting regular prenatal care. Follow your health care provider's schedule for blood and other necessary tests. Do not miss appointments.  Doing everything you can to keep yourself and your baby healthy during your pregnancy.  Getting complete care. Prenatal care should include evaluation of the medical, dietary, educational, psychological, and social needs of you and your significant other. The medical and genetic history of your family and the family of your baby's father should be discussed with your health care provider.  Discussing with your health care provider:  Prescription, over-the-counter, and herbal medicines that you take.  Any history of substance abuse, alcohol use, smoking, and illegal drug use.  Any history of domestic abuse and violence.  Immunizations you have received.  Your nutrition and diet.  The amount of exercise you do.  Any environmental and occupational hazards to which you are exposed.  History of sexually transmitted infections for both you and your partner.  Previous pregnancies you have had. WHY IS PRENATAL CARE SO IMPORTANT?  By regularly seeing your health care provider, you help ensure that problems can be identified early so that they can be treated as soon as possible. Other problems might be prevented. Many studies have shown that early and regular prenatal  care is important for the health of mothers and their babies.  HOW CAN I TAKE CARE OF MYSELF WHILE I AM PREGNANT?  Here are ways to take care of yourself and your baby:   Start or continue taking your multivitamin with 400 micrograms (mcg) of folic acid every day.  Get early and regular prenatal care. It is very important to see a health care provider during your pregnancy. Your health care provider will check at each visit to make sure that you and your baby are healthy. If there are any problems, action can be taken right away to help you and your baby.  Eat a healthy diet that includes:  Fruits.  Vegetables.  Foods low in saturated fat.  Whole grains.  Calcium-rich foods, such as milk, yogurt, and hard cheeses.  Drink 6-8 glasses of liquids a day.  Unless your health care provider tells you not to, try to be physically active for 30 minutes, most days of the week. If you are pressed for time, you can get your activity in through 10-minute segments, three times a day.  Do not smoke, drink alcohol, or use drugs. These can cause long-term damage to your baby. Talk with your health care provider about steps to take to stop smoking. Talk with a member of your faith community, a counselor, a trusted friend, or your health care provider if you are concerned about your alcohol or drug use.  Ask your health care provider before taking any medicine, even over-the-counter medicines. Some medicines are not safe to take during pregnancy.  Get plenty of rest and sleep.  Avoid  hot tubs and saunas during pregnancy.  Do not have X-rays taken unless absolutely necessary and with the recommendation of your health care provider. A lead shield can be placed on your abdomen to protect your baby when X-rays are taken in other parts of your body.  Do not empty the cat litter when you are pregnant. It may contain a parasite that causes an infection called toxoplasmosis, which can cause birth defects.  Also, use gloves when working in garden areas used by cats.  Do not eat uncooked or undercooked meats or fish.  Do not eat soft, mold-ripened cheeses (Brie, Camembert, and chevre) or soft, blue-veined cheese (Danish blue and Roquefort).  Stay away from toxic chemicals like:  Insecticides.  Solvents (some cleaners or paint thinners).  Lead.  Mercury.  Sexual intercourse may continue until the end of the pregnancy, unless you have a medical problem or there is a problem with the pregnancy and your health care provider tells you not to.  Do not wear high-heel shoes, especially during the second half of the pregnancy. You can lose your balance and fall.  Do not take long trips, unless absolutely necessary. Be sure to see your health care provider before going on the trip.  Do not sit in one position for more than 2 hours when on a trip.  Take a copy of your medical records when going on a trip. Know where a hospital is located in the city you are visiting, in case of an emergency.  Most dangerous household products will have pregnancy warnings on their labels. Ask your health care provider about products if you are unsure.  Limit or eliminate your caffeine intake from coffee, tea, sodas, medicines, and chocolate.  Many women continue working through pregnancy. Staying active might help you stay healthier. If you have a question about the safety or the hours you work at your particular job, talk with your health care provider.  Get informed:  Read books.  Watch videos.  Go to childbirth classes for you and your significant other.  Talk with experienced moms.  Ask your health care provider about childbirth education classes for you and your partner. Classes can help you and your partner prepare for the birth of your baby.  Ask about a baby doctor (pediatrician) and methods and pain medicine for labor, delivery, and possible cesarean delivery. HOW OFTEN SHOULD I SEE MY HEALTH  CARE PROVIDER DURING PREGNANCY?  Your health care provider will give you a schedule for your prenatal visits. You will have visits more often as you get closer to the end of your pregnancy. An average pregnancy lasts about 40 weeks.  A typical schedule includes visiting your health care provider:   About once each month during your first 6 months of pregnancy.  Every 2 weeks during the next 2 months.  Weekly in the last month, until the delivery date. Your health care provider will probably want to see you more often if:  You are older than 35 years.  Your pregnancy is high risk because you have certain health problems or problems with the pregnancy, such as:  Diabetes.  High blood pressure.  The baby is not growing on schedule, according to the dates of the pregnancy. Your health care provider will do special tests to make sure you and your baby are not having any serious problems. WHAT HAPPENS DURING PRENATAL VISITS?   At your first prenatal visit, your health care provider will do a physical exam and talk  to you about your health history and the health history of your partner and your family. Your health care provider will be able to tell you what date to expect your baby to be born on.  Your first physical exam will include checks of your blood pressure, measurements of your height and weight, and an exam of your pelvic organs. Your health care provider will do a Pap test if you have not had one recently and will do cultures of your cervix to make sure there is no infection.  At each prenatal visit, there will be tests of your blood, urine, blood pressure, weight, and the progress of the baby will be checked.  At your later prenatal visits, your health care provider will check how you are doing and how your baby is developing. You may have a number of tests done as your pregnancy progresses.  Ultrasound exams are often used to check on your baby's growth and health.  You may have  more urine and blood tests, as well as special tests, if needed. These may include amniocentesis to examine fluid in the pregnancy sac, stress tests to check how the baby responds to contractions, or a biophysical profile to measure your baby's well-being. Your health care provider will explain the tests and why they are necessary.  You should be tested for high blood sugar (gestational diabetes) between the 24th and 28th weeks of your pregnancy.  You should discuss with your health care provider your plans to breastfeed or bottle-feed your baby.  Each visit is also a chance for you to learn about staying healthy during pregnancy and to ask questions. Document Released: 03/03/2003 Document Revised: 03/05/2013 Document Reviewed: 05/15/2013 [facility] Patient Information 2015 [place], [place]. This information is not intended to replace advice given to you by your health care provider. Make sure you discuss any questions you have with your health care provider.

## 2014-12-11 NOTE — Progress Notes (Addendum)
Wanda Matthews is a 19 y.o. G1P0 at [redacted]w[redacted]d for routine follow up.  She reports no problem today, not compliant with PNV, she plans to get some today at the pharmacy.  See flow sheet for details.  Exam: Gen: Not in distress. Abd: Gravid. FHR and FH documented in flow sheet.         Bedside U/S done: Presentation cephalic with normal fetal heart tone. Ext: No pedal swelling.  A/P: Pregnancy at [redacted]w[redacted]d.  Doing well.   Pregnancy issues include none.  Infant feeding choice both breast and bottle feeding. Contraception choice Nexplanon. Infant circumcision desired not applicable  Tdapwas not given today. Flu shot recommended. She will get it at the pharmacy as soon as possible. GBS/GC/CZ testing was not performed today. PCP to obtain report of her anatomy scan from Dr Elsie Stain office.  Preterm labor precautions reviewed. Safe sleep discussed. Kick counts reviewed. Follow up 2 weeks.

## 2014-12-11 NOTE — Progress Notes (Signed)
Prenatal counseling for impending newborn done .pren

## 2014-12-30 ENCOUNTER — Ambulatory Visit (INDEPENDENT_AMBULATORY_CARE_PROVIDER_SITE_OTHER): Payer: Self-pay | Admitting: Family Medicine

## 2014-12-30 VITALS — BP 116/66 | HR 91 | Temp 98.7°F | Wt 205.0 lb

## 2014-12-30 DIAGNOSIS — Z3402 Encounter for supervision of normal first pregnancy, second trimester: Secondary | ICD-10-CM

## 2014-12-30 NOTE — Patient Instructions (Signed)

## 2014-12-30 NOTE — Progress Notes (Signed)
Wanda Matthews is a 19 y.o. G1P0 at 4118w2d for routine follow up.  She reports acid reflux. No LOF, contractions, or vaginal bleeding. No contractions. Good fetal movement.  See flow sheet for details.  A/P: Pregnancy at 8218w2d.  Doing well.   Pregnancy issues include none.   Infant feeding choice breastfeeding (completed a class).  Contraception choice Nexplanon Infant circumcision desired not applicable (girl)  Tdap was given on 11/10/14. GBS/GC/CZ testing was not performed today, to be done at next appt in 2 weeks.  Preterm labor precautions reviewed. Safe sleep discussed. Kick counts reviewed. Follow up 2 weeks.

## 2015-01-14 ENCOUNTER — Telehealth: Payer: Self-pay | Admitting: Family Medicine

## 2015-01-14 NOTE — Telephone Encounter (Signed)
Can we please attempt to contact Dr. Elsie StainMarshall's office again for a copy of her U/S?   Thank you, Joanna Puffrystal S. Dorsey, MD [facility]  01/14/2015, 4:14 PM

## 2015-01-15 ENCOUNTER — Ambulatory Visit (INDEPENDENT_AMBULATORY_CARE_PROVIDER_SITE_OTHER): Payer: Self-pay | Admitting: Family Medicine

## 2015-01-15 ENCOUNTER — Other Ambulatory Visit (HOSPITAL_COMMUNITY): Admission: RE | Admit: 2015-01-15 | Payer: Self-pay | Source: Ambulatory Visit | Admitting: Family Medicine

## 2015-01-15 VITALS — BP 123/71 | HR 100 | Temp 98.7°F | Wt 209.0 lb

## 2015-01-15 DIAGNOSIS — Z3483 Encounter for supervision of other normal pregnancy, third trimester: Secondary | ICD-10-CM

## 2015-01-15 DIAGNOSIS — Z3402 Encounter for supervision of normal first pregnancy, second trimester: Secondary | ICD-10-CM

## 2015-01-15 NOTE — Telephone Encounter (Signed)
Requested from Dr. Tawny HoppingMarshalls office. [place], Maryjo RochesterJessica Dawn

## 2015-01-15 NOTE — Progress Notes (Signed)
Wanda Matthews is a 19 y.o. G1P0 at 5146w4d for routine follow up.  She reports nasal congestion and sore throat. She's been using Vicks under her nose. No contractions, LOF, vaginal bleeding.    See flow sheet for details.  A/P: Pregnancy at 1646w4d.  Doing well.   Pregnancy issues include None  U/S reviewed from 09/12/14: single gestation, CRL 2039w2d, EDD by U/S was 01/28/15 but noted best EDD was by LMP on 02/01/15. Normal fetal anatomy, female, placental right lateral. Will attempt to have this uploaded quickly into Epic system.   Infant feeding choice: breast and bottle  Contraception choice Nexplanon Infant circumcision desired not applicable GBS/GC/CZ was obtained today  Labor precautions reviewed. Kick counts reviewed.

## 2015-01-15 NOTE — Patient Instructions (Addendum)
Things you can take: Guaifenesin (cough) Nasal saline Flonase (nasal spray steroid)  Third Trimester of Pregnancy The third trimester is from week 29 through week 42, months 7 through 9. The third trimester is a time when the fetus is growing rapidly. At the end of the ninth month, the fetus is about 20 inches in length and weighs 6-10 pounds.  BODY CHANGES Your body goes through many changes during pregnancy. The changes vary from woman to woman.   Your weight will continue to increase. You can expect to gain 25-35 pounds (11-16 kg) by the end of the pregnancy.  You may begin to get stretch marks on your hips, abdomen, and breasts.  You may urinate more often because the fetus is moving lower into your pelvis and pressing on your bladder.  You may develop or continue to have heartburn as a result of your pregnancy.  You may develop constipation because certain hormones are causing the muscles that push waste through your intestines to slow down.  You may develop hemorrhoids or swollen, bulging veins (varicose veins).  You may have pelvic pain because of the weight gain and pregnancy hormones relaxing your joints between the bones in your pelvis. Backaches may result from overexertion of the muscles supporting your posture.  You may have changes in your hair. These can include thickening of your hair, rapid growth, and changes in texture. Some women also have hair loss during or after pregnancy, or hair that feels dry or thin. Your hair will most likely return to normal after your baby is born.  Your breasts will continue to grow and be tender. A yellow discharge may leak from your breasts called colostrum.  Your belly button may stick out.  You may feel short of breath because of your expanding uterus.  You may notice the fetus "dropping," or moving lower in your abdomen.  You may have a bloody mucus discharge. This usually occurs a few days to a week before labor begins.  Your  cervix becomes thin and soft (effaced) near your due date. WHAT TO EXPECT AT YOUR PRENATAL EXAMS  You will have prenatal exams every 2 weeks until week 36. Then, you will have weekly prenatal exams. During a routine prenatal visit:  You will be weighed to make sure you and the fetus are growing normally.  Your blood pressure is taken.  Your abdomen will be measured to track your baby's growth.  The fetal heartbeat will be listened to.  Any test results from the previous visit will be discussed.  You may have a cervical check near your due date to see if you have effaced. At around 36 weeks, your caregiver will check your cervix. At the same time, your caregiver will also perform a test on the secretions of the vaginal tissue. This test is to determine if a type of bacteria, Group B streptococcus, is present. Your caregiver will explain this further. Your caregiver may ask you:  What your birth plan is.  How you are feeling.  If you are feeling the baby move.  If you have had any abnormal symptoms, such as leaking fluid, bleeding, severe headaches, or abdominal cramping.  If you are using any tobacco products, including cigarettes, chewing tobacco, and electronic cigarettes.  If you have any questions. Other tests or screenings that may be performed during your third trimester include:  Blood tests that check for low iron levels (anemia).  Fetal testing to check the health, activity level, and growth of  the fetus. Testing is done if you have certain medical conditions or if there are problems during the pregnancy.  HIV (human immunodeficiency virus) testing. If you are at high risk, you may be screened for HIV during your third trimester of pregnancy. FALSE LABOR You may feel small, irregular contractions that eventually go away. These are called Braxton Hicks contractions, or false labor. Contractions may last for hours, days, or even weeks before true labor sets in. If  contractions come at regular intervals, intensify, or become painful, it is best to be seen by your caregiver.  SIGNS OF LABOR   Menstrual-like cramps.  Contractions that are 5 minutes apart or less.  Contractions that start on the top of the uterus and spread down to the lower abdomen and back.  A sense of increased pelvic pressure or back pain.  A watery or bloody mucus discharge that comes from the vagina. If you have any of these signs before the 37th week of pregnancy, call your caregiver right away. You need to go to the hospital to get checked immediately. HOME CARE INSTRUCTIONS   Avoid all smoking, herbs, alcohol, and unprescribed drugs. These chemicals affect the formation and growth of the baby.  Do not use any tobacco products, including cigarettes, chewing tobacco, and electronic cigarettes. If you need help quitting, ask your health care provider. You may receive counseling support and other resources to help you quit.  Follow your caregiver's instructions regarding medicine use. There are medicines that are either safe or unsafe to take during pregnancy.  Exercise only as directed by your caregiver. Experiencing uterine cramps is a good sign to stop exercising.  Continue to eat regular, healthy meals.  Wear a good support bra for breast tenderness.  Do not use hot tubs, steam rooms, or saunas.  Wear your seat belt at all times when driving.  Avoid raw meat, uncooked cheese, cat litter boxes, and soil used by cats. These carry germs that can cause birth defects in the baby.  Take your prenatal vitamins.  Take 1500-2000 mg of calcium daily starting at the 20th week of pregnancy until you deliver your baby.  Try taking a stool softener (if your caregiver approves) if you develop constipation. Eat more high-fiber foods, such as fresh vegetables or fruit and whole grains. Drink plenty of fluids to keep your urine clear or pale yellow.  Take warm sitz baths to soothe  any pain or discomfort caused by hemorrhoids. Use hemorrhoid cream if your caregiver approves.  If you develop varicose veins, wear support hose. Elevate your feet for 15 minutes, 3-4 times a day. Limit salt in your diet.  Avoid heavy lifting, wear low heal shoes, and practice good posture.  Rest a lot with your legs elevated if you have leg cramps or low back pain.  Visit your dentist if you have not gone during your pregnancy. Use a soft toothbrush to brush your teeth and be gentle when you floss.  A sexual relationship may be continued unless your caregiver directs you otherwise.  Do not travel far distances unless it is absolutely necessary and only with the approval of your caregiver.  Take prenatal classes to understand, practice, and ask questions about the labor and delivery.  Make a trial run to the hospital.  Pack your hospital bag.  Prepare the baby's nursery.  Continue to go to all your prenatal visits as directed by your caregiver. SEEK MEDICAL CARE IF:  You are unsure if you are in labor  or if your water has broken.  You have dizziness.  You have mild pelvic cramps, pelvic pressure, or nagging pain in your abdominal area.  You have persistent nausea, vomiting, or diarrhea.  You have a bad smelling vaginal discharge.  You have pain with urination. SEEK IMMEDIATE MEDICAL CARE IF:   You have a fever.  You are leaking fluid from your vagina.  You have spotting or bleeding from your vagina.  You have severe abdominal cramping or pain.  You have rapid weight loss or gain.  You have shortness of breath with chest pain.  You notice sudden or extreme swelling of your face, hands, ankles, feet, or legs.  You have not felt your baby move in over an hour.  You have severe headaches that do not go away with medicine.  You have vision changes.   This information is not intended to replace advice given to you by your health care provider. Make sure you discuss  any questions you have with your health care provider.   Document Released: 02/22/2001 Document Revised: 03/21/2014 Document Reviewed: 05/01/2012 Elsevier Interactive Patient Education [employer]

## 2015-01-16 LAB — CERVICOVAGINAL ANCILLARY ONLY
Chlamydia: NEGATIVE
Neisseria Gonorrhea: NEGATIVE

## 2015-01-17 LAB — STREP B DNA PROBE: STREP GROUP B AG: NOT DETECTED

## 2015-01-19 NOTE — Telephone Encounter (Signed)
Received per Asha Benton,LPN. Fle[place], Wanda Matthews

## 2015-01-23 ENCOUNTER — Ambulatory Visit (INDEPENDENT_AMBULATORY_CARE_PROVIDER_SITE_OTHER): Payer: Self-pay | Admitting: Family Medicine

## 2015-01-23 VITALS — BP 124/69 | HR 81 | Temp 98.3°F | Wt 211.0 lb

## 2015-01-23 DIAGNOSIS — Z3483 Encounter for supervision of other normal pregnancy, third trimester: Secondary | ICD-10-CM

## 2015-01-23 NOTE — Progress Notes (Signed)
Wanda Matthews is a 19 y.o. G1P0 at 10631w5d for routine follow up.  She reports she's doing well. No complaints today.  See flow sheet for details.  A/P: Pregnancy at 5531w5d.  Doing well.   Pregnancy issues include none  GBS/GC/CZ results were reviewed today.   Labor & fetal movement precautions reviewed. Next visit in 1 week.

## 2015-01-23 NOTE — Patient Instructions (Signed)

## 2015-01-30 ENCOUNTER — Ambulatory Visit (INDEPENDENT_AMBULATORY_CARE_PROVIDER_SITE_OTHER): Payer: Self-pay | Admitting: Family Medicine

## 2015-01-30 VITALS — BP 121/77 | HR 88 | Temp 98.1°F | Wt 210.7 lb

## 2015-01-30 DIAGNOSIS — Z3483 Encounter for supervision of other normal pregnancy, third trimester: Secondary | ICD-10-CM

## 2015-01-30 DIAGNOSIS — O480 Post-term pregnancy: Secondary | ICD-10-CM

## 2015-01-30 NOTE — Progress Notes (Signed)
Wanda Matthews is a 19 y.o. G1P0 at 8078w5d for routine follow up.  She reports she's doing well. Denies cramping/ctx, fluid leaking, vaginal bleeding, or decreased fetal movement.  See flow sheet for details.  A/P: Pregnancy at 6878w5d.  Doing well.   Pregnancy issues include near term pregnancy. Due date is in two days.  Discussed with on call OB attending Dr. Jolayne Pantheronstant in order to schedule induction for 41w. She recommends NST with AFI be done either twice in 40th week of gestation, or alternatively can do it once at 3314w3d. As next week is a holiday week, the best plan is likely to have the NST and AFI done on Wednesday (5814w3d). We were unable to schedule this today as [facility] was closed this PM. Staff will call first thing Monday to schedule testing and get in touch with patient. Patient has been advised to call us if she has not heard from us with an appointment by Monday afternoon. She will return to see me on Tuesday. Induction scheduled for 3451w0d on 11/27 [facility]. Fortunately, she is already 3.5cm dilated and may spontaneously go into labor before next week. Labor & fetal movement precautions reviewed.

## 2015-01-30 NOTE — Patient Instructions (Signed)
Schedule an appointment to see me on Tuesday of next week We are getting your test at [facility] scheduled for Wednesday  You will be induced next Sunday 11/27 if you have not already gone into labor  If you have any contractions which occur regularly, vaginal bleeding, fluid leaking, or are worried that baby is not moving well, go immediately to [facility] to be evaluated.   Be well, Dr. Pollie Meyer   Third Trimester of Pregnancy The third trimester is from week 29 through week 42, months 7 through 9. The third trimester is a time when the fetus is growing rapidly. At the end of the ninth month, the fetus is about 20 inches in length and weighs 6-10 pounds.  BODY CHANGES Your body goes through many changes during pregnancy. The changes vary from woman to woman.   Your weight will continue to increase. You can expect to gain 25-35 pounds (11-16 kg) by the end of the pregnancy.  You may begin to get stretch marks on your hips, abdomen, and breasts.  You may urinate more often because the fetus is moving lower into your pelvis and pressing on your bladder.  You may develop or continue to have heartburn as a result of your pregnancy.  You may develop constipation because certain hormones are causing the muscles that push waste through your intestines to slow down.  You may develop hemorrhoids or swollen, bulging veins (varicose veins).  You may have pelvic pain because of the weight gain and pregnancy hormones relaxing your joints between the bones in your pelvis. Backaches may result from overexertion of the muscles supporting your posture.  You may have changes in your hair. These can include thickening of your hair, rapid growth, and changes in texture. Some women also have hair loss during or after pregnancy, or hair that feels dry or thin. Your hair will most likely return to normal after your baby is born.  Your breasts will continue to grow and be tender. A yellow  discharge may leak from your breasts called colostrum.  Your belly button may stick out.  You may feel short of breath because of your expanding uterus.  You may notice the fetus "dropping," or moving lower in your abdomen.  You may have a bloody mucus discharge. This usually occurs a few days to a week before labor begins.  Your cervix becomes thin and soft (effaced) near your due date. WHAT TO EXPECT AT YOUR PRENATAL EXAMS  You will have prenatal exams every 2 weeks until week 36. Then, you will have weekly prenatal exams. During a routine prenatal visit:  You will be weighed to make sure you and the fetus are growing normally.  Your blood pressure is taken.  Your abdomen will be measured to track your baby's growth.  The fetal heartbeat will be listened to.  Any test results from the previous visit will be discussed.  You may have a cervical check near your due date to see if you have effaced. At around 36 weeks, your caregiver will check your cervix. At the same time, your caregiver will also perform a test on the secretions of the vaginal tissue. This test is to determine if a type of bacteria, Group B streptococcus, is present. Your caregiver will explain this further. Your caregiver may ask you:  What your birth plan is.  How you are feeling.  If you are feeling the baby move.  If you have had any abnormal symptoms, such as leaking  fluid, bleeding, severe headaches, or abdominal cramping.  If you are using any tobacco products, including cigarettes, chewing tobacco, and electronic cigarettes.  If you have any questions. Other tests or screenings that may be performed during your third trimester include:  Blood tests that check for low iron levels (anemia).  Fetal testing to check the health, activity level, and growth of the fetus. Testing is done if you have certain medical conditions or if there are problems during the pregnancy.  HIV (human immunodeficiency  virus) testing. If you are at high risk, you may be screened for HIV during your third trimester of pregnancy. FALSE LABOR You may feel small, irregular contractions that eventually go away. These are called Braxton Hicks contractions, or false labor. Contractions may last for hours, days, or even weeks before true labor sets in. If contractions come at regular intervals, intensify, or become painful, it is best to be seen by your caregiver.  SIGNS OF LABOR   Menstrual-like cramps.  Contractions that are 5 minutes apart or less.  Contractions that start on the top of the uterus and spread down to the lower abdomen and back.  A sense of increased pelvic pressure or back pain.  A watery or bloody mucus discharge that comes from the vagina. If you have any of these signs before the 37th week of pregnancy, call your caregiver right away. You need to go to the hospital to get checked immediately. HOME CARE INSTRUCTIONS   Avoid all smoking, herbs, alcohol, and unprescribed drugs. These chemicals affect the formation and growth of the baby.  Do not use any tobacco products, including cigarettes, chewing tobacco, and electronic cigarettes. If you need help quitting, ask your health care provider. You may receive counseling support and other resources to help you quit.  Follow your caregiver's instructions regarding medicine use. There are medicines that are either safe or unsafe to take during pregnancy.  Exercise only as directed by your caregiver. Experiencing uterine cramps is a good sign to stop exercising.  Continue to eat regular, healthy meals.  Wear a good support bra for breast tenderness.  Do not use hot tubs, steam rooms, or saunas.  Wear your seat belt at all times when driving.  Avoid raw meat, uncooked cheese, cat litter boxes, and soil used by cats. These carry germs that can cause birth defects in the baby.  Take your prenatal vitamins.  Take 1500-2000 mg of calcium daily  starting at the 20th week of pregnancy until you deliver your baby.  Try taking a stool softener (if your caregiver approves) if you develop constipation. Eat more high-fiber foods, such as fresh vegetables or fruit and whole grains. Drink plenty of fluids to keep your urine clear or pale yellow.  Take warm sitz baths to soothe any pain or discomfort caused by hemorrhoids. Use hemorrhoid cream if your caregiver approves.  If you develop varicose veins, wear support hose. Elevate your feet for 15 minutes, 3-4 times a day. Limit salt in your diet.  Avoid heavy lifting, wear low heal shoes, and practice good posture.  Rest a lot with your legs elevated if you have leg cramps or low back pain.  Visit your dentist if you have not gone during your pregnancy. Use a soft toothbrush to brush your teeth and be gentle when you floss.  A sexual relationship may be continued unless your caregiver directs you otherwise.  Do not travel far distances unless it is absolutely necessary and only with the approval  of your caregiver.  Take prenatal classes to understand, practice, and ask questions about the labor and delivery.  Make a trial run to the hospital.  Pack your hospital bag.  Prepare the baby's nursery.  Continue to go to all your prenatal visits as directed by your caregiver. SEEK MEDICAL CARE IF:  You are unsure if you are in labor or if your water has broken.  You have dizziness.  You have mild pelvic cramps, pelvic pressure, or nagging pain in your abdominal area.  You have persistent nausea, vomiting, or diarrhea.  You have a bad smelling vaginal discharge.  You have pain with urination. SEEK IMMEDIATE MEDICAL CARE IF:   You have a fever.  You are leaking fluid from your vagina.  You have spotting or bleeding from your vagina.  You have severe abdominal cramping or pain.  You have rapid weight loss or gain.  You have shortness of breath with chest pain.  You notice  sudden or extreme swelling of your face, hands, ankles, feet, or legs.  You have not felt your baby move in over an hour.  You have severe headaches that do not go away with medicine.  You have vision changes.   This information is not intended to replace advice given to you by your health care provider. Make sure you discuss any questions you have with your health care provider.   Document Released: 02/22/2001 Document Revised: 03/21/2014 Document Reviewed: 05/01/2012 Elsevier Interactive Patient Education [employer]

## 2015-01-31 ENCOUNTER — Encounter (HOSPITAL_COMMUNITY): Payer: Self-pay | Admitting: Anesthesiology

## 2015-01-31 ENCOUNTER — Inpatient Hospital Stay (HOSPITAL_COMMUNITY)
Admission: AD | Admit: 2015-01-31 | Discharge: 2015-02-02 | DRG: 775 | Disposition: A | Discharge status: Home / Self Care | Payer: Medicaid Other | Source: Ambulatory Visit | Attending: Obstetrics and Gynecology | Admitting: Obstetrics and Gynecology

## 2015-01-31 ENCOUNTER — Encounter (HOSPITAL_COMMUNITY): Payer: Self-pay | Admitting: *Deleted

## 2015-01-31 DIAGNOSIS — Z3A39 39 weeks gestation of pregnancy: Secondary | ICD-10-CM

## 2015-01-31 DIAGNOSIS — IMO0001 Reserved for inherently not codable concepts without codable children: Secondary | ICD-10-CM

## 2015-01-31 DIAGNOSIS — Z3403 Encounter for supervision of normal first pregnancy, third trimester: Secondary | ICD-10-CM | POA: Diagnosis present

## 2015-01-31 HISTORY — DX: Other specified health status: Z78.9

## 2015-01-31 LAB — TYPE AND SCREEN
ABO/RH(D): O POS
ANTIBODY SCREEN: NEGATIVE

## 2015-01-31 LAB — CBC
HCT: 35.6 % — ABNORMAL LOW (ref 36.0–46.0)
HEMOGLOBIN: 12.4 g/dL (ref 12.0–15.0)
MCH: 30.8 pg (ref 26.0–34.0)
MCHC: 34.8 g/dL (ref 30.0–36.0)
MCV: 88.6 fL (ref 78.0–100.0)
Platelets: 137 10*3/uL — ABNORMAL LOW (ref 150–400)
RBC: 4.02 MIL/uL (ref 3.87–5.11)
RDW: 13.9 % (ref 11.5–15.5)
WBC: 6.9 10*3/uL (ref 4.0–10.5)

## 2015-01-31 LAB — OB RESULTS CONSOLE GBS: GBS: NEGATIVE

## 2015-01-31 LAB — GROUP B STREP BY PCR: GROUP B STREP BY PCR: NEGATIVE

## 2015-01-31 LAB — ABO/RH: ABO/RH(D): O POS

## 2015-01-31 MED ORDER — ONDANSETRON HCL 4 MG/2ML IJ SOLN
4.0000 mg | Freq: Four times a day (QID) | INTRAMUSCULAR | Status: DC | PRN
  Administered 2015-01-31: 4 mg via INTRAVENOUS
  Filled 2015-01-31: qty 2

## 2015-01-31 MED ORDER — LIDOCAINE HCL (PF) 1 % IJ SOLN
30.0000 mL | INTRAMUSCULAR | Status: DC | PRN
  Administered 2015-01-31: 30 mL via SUBCUTANEOUS
  Filled 2015-01-31: qty 30

## 2015-01-31 MED ORDER — FENTANYL CITRATE (PF) 100 MCG/2ML IJ SOLN
100.0000 ug | INTRAMUSCULAR | Status: DC | PRN
  Administered 2015-01-31 (×2): 100 ug via INTRAVENOUS
  Filled 2015-01-31 (×2): qty 2

## 2015-01-31 MED ORDER — LACTATED RINGERS IV SOLN
INTRAVENOUS | Status: DC
  Administered 2015-01-31 (×2): via INTRAVENOUS

## 2015-01-31 MED ORDER — CITRIC ACID-SODIUM CITRATE 334-500 MG/5ML PO SOLN: 30.0000 mL | ORAL | Status: DC | PRN

## 2015-01-31 MED ORDER — LACTATED RINGERS IV SOLN: 500.0000 mL | INTRAVENOUS | Status: DC | PRN

## 2015-01-31 MED ORDER — OXYTOCIN BOLUS FROM INFUSION: 500.0000 mL | INTRAVENOUS | Status: DC

## 2015-01-31 MED ORDER — FENTANYL BOLUS VIA INFUSION: 100.0000 ug | Freq: Once | INTRAVENOUS | Status: DC

## 2015-01-31 MED ORDER — OXYTOCIN 40 UNITS IN LACTATED RINGERS INFUSION - SIMPLE MED
62.5000 mL/h | INTRAVENOUS | Status: DC
  Administered 2015-01-31: 62.5 mL/h via INTRAVENOUS
  Filled 2015-01-31: qty 1000

## 2015-01-31 MED ORDER — ACETAMINOPHEN 325 MG PO TABS: 650.0000 mg | ORAL_TABLET | ORAL | Status: DC | PRN

## 2015-01-31 NOTE — Progress Notes (Signed)
   Wanda Matthews is a 19 y.o. G1P0 at 5357w6d  admitted for blood tinged mucous found to be 6cm dilated.   Subjective: Patient doing well, feeling contractions frequently. Denies pharmacologic intervention for pain.  Objective: Filed Vitals:   01/31/15 1339 01/31/15 1348 01/31/15 1604 01/31/15 1719  BP: 128/79  113/62 115/77  Pulse: 117  102 92  Temp:    98 F (36.7 C)  TempSrc:    Oral  Resp: 20   18  Height:  5\' 6"  (1.676 m)    Weight:  96.616 kg (213 lb)        FHT:  FHR: 145 bpm, variability: moderate,  accelerations:  Present,  decelerations:  Absent UC:   regular, every 1-3 minutes; difficult fetal monitoring due to patient's movement.  SVE:   Dilation: 6.5 Effacement (%): 70 Station: -2 Exam by:: Dr. Rockie NeighboursWoak    Labs: Lab Results  Component Value Date   WBC 6.9 01/31/2015   HGB 12.4 01/31/2015   HCT 35.6* 01/31/2015   MCV 88.6 01/31/2015   PLT 137* 01/31/2015    Assessment / Plan: Spontaneous labor, progressing normally  Labor: Progressing slowly, given it has not been 2 hours since her last check, will defer for now. May ultimately need to AROM if she continues to fail to progress adequately Fetal Wellbeing:  Category I Pain Control:  Labor support without medications Anticipated MOD:  NSVD  [name] 01/31/2015, 6:12 PM

## 2015-01-31 NOTE — H&P (Signed)
LABOR AND DELIVERY ADMISSION HISTORY AND PHYSICAL NOTE  Wanda Matthews is a 19 y.o. female G1P0 with IUP at [redacted]w[redacted]d by L20/ presenting for leakage of blood-tinged mucous and mild contractions beginning early this morning. She reports +FMs, No LOF, no VB, no blurry vision, headaches or peripheral edema, and RUQ pain.     Prenatal History/Complications:  Past Medical History: Past Medical History  Diagnosis Date  . Medical history non-contributory     Past Surgical History: Past Surgical History  Procedure Laterality Date  . No past surgeries      Obstetrical History: OB History    Gravida Para Term Preterm AB TAB SAB Ectopic Multiple Living   1               Social History: Social History   Social History  . Marital Status: Married    Spouse Name: N/A  . Number of Children: N/A  . Years of Education: N/A   Social History Main Topics  . Smoking status: Never Smoker   . Smokeless tobacco: None  . Alcohol Use: No  . Drug Use: No  . Sexual Activity: Yes   Other Topics Concern  . None   Social History Narrative    Family History: History reviewed. No pertinent family history.  Allergies: No Known Allergies  Prescriptions prior to admission  Medication Sig Dispense Refill Last Dose  . calcium carbonate (TUMS - DOSED IN MG ELEMENTAL CALCIUM) 500 MG chewable tablet Chew 1 tablet by mouth daily as needed for indigestion or heartburn.   01/30/2015 at Unknown time  . Prenatal Vit-Fe Fumarate-FA (MULTIVITAMIN-PRENATAL) 27-0.8 MG TABS tablet Take 1 tablet by mouth daily at 12 noon. 30 each 11 01/30/2015 at Unknown time     Review of Systems   All systems reviewed and negative except as stated in HPI  Blood pressure 119/74, pulse 122, temperature 98.8 F (37.1 C), temperature source Oral, resp. rate 16, weight 213 lb 3.2 oz (96.707 kg), last menstrual period 04/27/2014. General appearance: alert, cooperative and appears stated age Lungs: clear to auscultation  bilaterally Heart: regular rate and rhythm Abdomen: soft, non-tender; bowel sounds normal Extremities: No calf swelling or tenderness Presentation: cephalic Fetal monitoring: 140/mod/+a/-d Uterine activity: q 5 minutes  Dilation: 6 Effacement (%): 60 Station: -2 Exam by:: Dr. Ashok Pall   Prenatal labs: ABO, Rh: O/POS/-- (05/03 1610) Antibody: NEG (05/03 0918) Rubella: !Error!immune RPR: NON REAC (08/29 1235)  HBsAg: NEGATIVE (05/03 9604)  HIV: NONREACTIVE (08/29 1235)  GBS: NOT DETECTED (11/03 1515)  1 hr Glucola 116 Genetic screening  Quad neg Anatomy US wnl  Prenatal Transfer Tool  Maternal Diabetes: No Genetic Screening: Normal Maternal Ultrasounds/Referrals: Normal Fetal Ultrasounds or other Referrals:  None Maternal Substance Abuse:  No Significant Maternal Medications:  None Significant Maternal Lab Results: Lab values include: Other: gbs unknown  No results found for this or any previous visit (from the past 24 hour(s)).  Patient Active Problem List   Diagnosis Date Noted  . Elevated glucose tolerance test 08/28/2014  . Supervision of normal first pregnancy in second trimester 07/22/2014    Assessment: Wanda Matthews is a 19 y.o. G1P0 at [redacted]w[redacted]d here for SOL. 3.5 cm yesterday, today 6. Contractions are mild, but given cervical dilation and patient's full term status will admit.  #Labor: expectant #Pain: Desires non-pharmacologic #FWB: Cat 1 #ID:  gbs unknown (rapid test 2 weeks ago negative, but their sensitivity is poor, and so cbc recommends treating this as unknown). Rapid pcr ordered and  will start pcn if positive #MOF:  Breast and bottle #MOC: nexplanon #Circ:  n/a  [name] 01/31/2015, 1:16 PM

## 2015-01-31 NOTE — MAU Provider Note (Cosign Needed)
History     CSN: [id number]  Arrival date and time: 01/31/15 1141   None     Chief Complaint  Patient presents with  . Vaginal Bleeding  . Vaginal Discharge   HPI Comments: [name] is a 19yo AA G1P0 female who is at gestational age 6925w6d who is at the MAU today for one episode of scant vaginal discharge with blood this morning. [name] states that she noticed a "palm-full" of thick- clear mucous fluid with small specs of blood in her undergarments when she woke up this morning at 11:00am. She immediately went to the MAU after noticing the discharge as she states that her PCP recommended for her to go to the MAU if she saw any vaginal bleeding. She has not had any more discharge since this morning. She also denies changes in her bowel movements, melena, dysuria or hematuria. She also denies any trauma.   In addition to discharge, she also states that she has been having suprapubic cramps since last night. Cramps occur sporatically and last for several seconds. When asked, she points to the area of the pubis symphysis and states that she also feels cramps inside her vagina. The cramps have not increased in frequency or intensity since they started last night.     Pertinent Gynecological History: Prenatal labs  ABO, Rh O/POS/-- (05/03 78290918) Antibody Negative Rubella 5.95 (05/03 0918)  RPR NON REAC (08/29 1235)  HBsAg NEGATIVE (05/03 0918)  HIVNONREACTIVE (08/29 1235) GBS NOT DETECTED (11/03 1515)   Prenatal care: good. Pregnancy complications: None Sexually transmitted diseases: No past hx Previous GYN Procedures: None    Past Medical History  Diagnosis Date  . Medical history non-contributory     Past Surgical History  Procedure Laterality Date  . No past surgeries      History reviewed. No pertinent family history.  Social History  Substance Use Topics  . Smoking status: Never Smoker   . Smokeless tobacco: None  . Alcohol Use: No    Allergies:  No Known Allergies  Prescriptions prior to admission  Medication Sig Dispense Refill Last Dose  . calcium carbonate (TUMS - DOSED IN MG ELEMENTAL CALCIUM) 500 MG chewable tablet Chew 1 tablet by mouth daily as needed for indigestion or heartburn.   01/30/2015 at Unknown time  . Prenatal Vit-Fe Fumarate-FA (MULTIVITAMIN-PRENATAL) 27-0.8 MG TABS tablet Take 1 tablet by mouth daily at 12 noon. 30 each 11 01/30/2015 at Unknown time    Review of Systems  Constitutional: Negative for fever, chills and diaphoresis.  Gastrointestinal: Negative for abdominal pain, diarrhea, blood in stool and melena.  Genitourinary: Negative for dysuria, urgency, frequency and hematuria.  Neurological: Negative for weakness.   Physical Exam   Blood pressure 119/74, pulse 122, temperature 98.8 F (37.1 C), temperature source Oral, resp. rate 16, weight 96.707 kg (213 lb 3.2 oz), last menstrual period 04/27/2014.  Physical Exam  Constitutional:  Ms Wanda Matthews was pleasant, well-appearing, and in no distress during the exam   Cervical Exam @1300  Dilation: 6 Effacement (%): 60 Cervical Position: Middle Station: -2 Presentation: Vertex Exam by:: Dr. Ashok PallWouk  MAU Course  Procedures  MDM Upon arrival, sterile vaginal exam by RN was significant for 4.5/70/-3, vertex, which has increased from 3.5cm dilation yesterday at routine prenatal visit. Repeat sterile vaginal exam one hour later by Dr. Ashok PallWouk at 1300 was 6/60/-2, vertex. FHT is category 1. Uterine contractions noted per 2-3 minutes, however difficult to assess as patient was laying on her side.  Assessment and Plan   Bloody vaginal discharge and contractions: Patient's presentation is consistent with pre-labor with significant increases in cervical dilation and effacement in the past 24 hours. Amniotic sac still intact.   Plan: 1) Admit patient to labor and delivery unit. 2) Dr. Rodrigo Ran was notified of patient's admission and reports that she will  check on her this afternoon.  Wanda Matthews 01/31/2015, 12:42 PM

## 2015-01-31 NOTE — Progress Notes (Signed)
   Natosha Maman-Alfa is a 19 y.o. G1P0 at 4942w6d  admitted for blood with mucous found to be 4.5cm then subsequently 6cm.    Subjective: Very uncomfortable. Having pain constantly and contractions are very frequent. Pain in back and lower abdomen. Feelings of wanting to push. Patient and family asking for AROM, discuss that this will increase pain/pressure, may or may not hasten delivery.  Objective: Filed Vitals:   01/31/15 1719 01/31/15 1920 01/31/15 1949 01/31/15 2128  BP: 115/77 132/62 131/88 135/87  Pulse: 92 96 96 105  Temp: 98 F (36.7 C) 98.2 F (36.8 C)    TempSrc: Oral Oral    Resp: 18 18 18 18   Height:      Weight:          FHT:  FHR: 145 bpm, variability: moderate,  accelerations:  Present,  decelerations:  Present occasional early UC:   regular, every 1-2 minutes SVE:   Dilation: 9 Effacement (%): 90 Station: 0 Exam by:: Ace GinsL. Cresenzo, RN   Labs: Lab Results  Component Value Date   WBC 6.9 01/31/2015   HGB 12.4 01/31/2015   HCT 35.6* 01/31/2015   MCV 88.6 01/31/2015   PLT 137* 01/31/2015    Assessment / Plan: Spontaneous labor, progressing normally  Labor: Progressing normally; AROM with light meconium Fetal Wellbeing:  Category I Pain Control:  Labor support without medications Anticipated MOD:  NSVD  Yavuz Kirby 01/31/2015, 9:59 PM

## 2015-01-31 NOTE — MAU Note (Signed)
Noted some mucous d/c with a little blood. Was seen and checked yesterday, was 3+cm

## 2015-01-31 NOTE — Progress Notes (Signed)
   Wanda Matthews is a 19 y.o. G1P0 at 2310w6d admitted for blood with mucous found to be 4.5cm then subsequently 6cm.   Subjective: Doing well, feeling more contractions now. Duration of contractions is longer.   Objective: Filed Vitals:   01/31/15 1348 01/31/15 1604 01/31/15 1719 01/31/15 1920  BP:  113/62 115/77 132/62  Pulse:  102 92 96  Temp:   98 F (36.7 C) 98.2 F (36.8 C)  TempSrc:   Oral Oral  Resp:   18   Height: 5\' 6"  (1.676 m)     Weight: 96.616 kg (213 lb)         FHT:  FHR: 145 bpm, variability: moderate,  accelerations:  Present,  decelerations:  Absent UC:   regular, every 2-3 minutes SVE:   Dilation: 7.5 Effacement (%): 70 Station: -1 Exam by:: Marcelino DusterMichelle, RN    Labs: Lab Results  Component Value Date   WBC 6.9 01/31/2015   HGB 12.4 01/31/2015   HCT 35.6* 01/31/2015   MCV 88.6 01/31/2015   PLT 137* 01/31/2015    Assessment / Plan: Spontaneous labor, progressing normally  Labor: Progressing normally  Fetal Wellbeing:  Category I Pain Control:  Labor support without medications Anticipated MOD:  NSVD  [facility], however [facility] (she has already had prenatal counseling there).   Galena Logie 01/31/2015, 7:52 PM

## 2015-02-01 ENCOUNTER — Encounter (HOSPITAL_COMMUNITY): Payer: Self-pay | Admitting: *Deleted

## 2015-02-01 DIAGNOSIS — Z3A39 39 weeks gestation of pregnancy: Secondary | ICD-10-CM

## 2015-02-01 LAB — CBC
HEMATOCRIT: 31.8 % — AB (ref 36.0–46.0)
HEMOGLOBIN: 11 g/dL — AB (ref 12.0–15.0)
MCH: 30.6 pg (ref 26.0–34.0)
MCHC: 34.6 g/dL (ref 30.0–36.0)
MCV: 88.3 fL (ref 78.0–100.0)
Platelets: 132 10*3/uL — ABNORMAL LOW (ref 150–400)
RBC: 3.6 MIL/uL — ABNORMAL LOW (ref 3.87–5.11)
RDW: 13.8 % (ref 11.5–15.5)
WBC: 14.9 10*3/uL — AB (ref 4.0–10.5)

## 2015-02-01 LAB — RPR: RPR: NONREACTIVE

## 2015-02-01 MED ORDER — SENNOSIDES-DOCUSATE SODIUM 8.6-50 MG PO TABS
2.0000 | ORAL_TABLET | ORAL | Status: DC
  Administered 2015-02-01: 2 via ORAL
  Filled 2015-02-01: qty 2

## 2015-02-01 MED ORDER — FENTANYL CITRATE (PF) 100 MCG/2ML IJ SOLN: 100.0000 ug | Freq: Once | INTRAMUSCULAR | Status: DC

## 2015-02-01 MED ORDER — DIPHENHYDRAMINE HCL 25 MG PO CAPS: 25.0000 mg | ORAL_CAPSULE | Freq: Four times a day (QID) | ORAL | Status: DC | PRN

## 2015-02-01 MED ORDER — ONDANSETRON HCL 4 MG PO TABS
4.0000 mg | ORAL_TABLET | ORAL | Status: DC | PRN
Start: 2015-02-01 — End: 2015-02-01

## 2015-02-01 MED ORDER — TETANUS-DIPHTH-ACELL PERTUSSIS 5-2.5-18.5 LF-MCG/0.5 IM SUSP: 0.5000 mL | Freq: Once | INTRAMUSCULAR | Status: DC

## 2015-02-01 MED ORDER — ONDANSETRON HCL 4 MG/2ML IJ SOLN: 4.0000 mg | INTRAMUSCULAR | Status: DC | PRN

## 2015-02-01 MED ORDER — PRENATAL MULTIVITAMIN CH: 1.0000 | ORAL_TABLET | Freq: Every day | ORAL | Status: DC

## 2015-02-01 MED ORDER — BENZOCAINE-MENTHOL 20-0.5 % EX AERO
1.0000 "application " | INHALATION_SPRAY | CUTANEOUS | Status: DC | PRN
  Administered 2015-02-01: 1 via TOPICAL
  Filled 2015-02-01: qty 56

## 2015-02-01 MED ORDER — ZOLPIDEM TARTRATE 5 MG PO TABS: 5.0000 mg | ORAL_TABLET | Freq: Every evening | ORAL | Status: DC | PRN

## 2015-02-01 MED ORDER — SENNOSIDES-DOCUSATE SODIUM 8.6-50 MG PO TABS: 2.0000 | ORAL_TABLET | ORAL | Status: DC

## 2015-02-01 MED ORDER — OXYCODONE-ACETAMINOPHEN 5-325 MG PO TABS
1.0000 | ORAL_TABLET | ORAL | Status: DC | PRN
  Administered 2015-02-01: 1 via ORAL
  Filled 2015-02-01: qty 1

## 2015-02-01 MED ORDER — OXYCODONE-ACETAMINOPHEN 5-325 MG PO TABS: 2.0000 | ORAL_TABLET | ORAL | Status: DC | PRN

## 2015-02-01 MED ORDER — SIMETHICONE 80 MG PO CHEW
80.0000 mg | CHEWABLE_TABLET | ORAL | Status: DC | PRN
Start: 2015-02-01 — End: 2015-02-01

## 2015-02-01 MED ORDER — SIMETHICONE 80 MG PO CHEW: 80.0000 mg | CHEWABLE_TABLET | ORAL | Status: DC | PRN

## 2015-02-01 MED ORDER — ONDANSETRON HCL 4 MG PO TABS: 4.0000 mg | ORAL_TABLET | ORAL | Status: DC | PRN

## 2015-02-01 MED ORDER — IBUPROFEN 600 MG PO TABS
600.0000 mg | ORAL_TABLET | Freq: Four times a day (QID) | ORAL | Status: DC
Start: 2015-02-01 — End: 2015-02-02
  Administered 2015-02-01 – 2015-02-02 (×5): 600 mg via ORAL
  Filled 2015-02-01 (×5): qty 1

## 2015-02-01 MED ORDER — WITCH HAZEL-GLYCERIN EX PADS: 1.0000 "application " | MEDICATED_PAD | CUTANEOUS | Status: DC | PRN

## 2015-02-01 MED ORDER — OXYTOCIN 40 UNITS IN LACTATED RINGERS INFUSION - SIMPLE MED: 62.5000 mL/h | INTRAVENOUS | Status: DC | PRN

## 2015-02-01 MED ORDER — ACETAMINOPHEN 325 MG PO TABS: 650.0000 mg | ORAL_TABLET | ORAL | Status: DC | PRN

## 2015-02-01 MED ORDER — IBUPROFEN 600 MG PO TABS
600.0000 mg | ORAL_TABLET | Freq: Four times a day (QID) | ORAL | Status: DC
  Administered 2015-02-01: 600 mg via ORAL
  Filled 2015-02-01: qty 1

## 2015-02-01 MED ORDER — MEASLES, MUMPS & RUBELLA VAC ~~LOC~~ INJ
0.5000 mL | INJECTION | Freq: Once | SUBCUTANEOUS | Status: DC
  Filled 2015-02-01: qty 0.5

## 2015-02-01 MED ORDER — LANOLIN HYDROUS EX OINT: TOPICAL_OINTMENT | CUTANEOUS | Status: DC | PRN

## 2015-02-01 MED ORDER — BENZOCAINE-MENTHOL 20-0.5 % EX AERO: 1.0000 "application " | INHALATION_SPRAY | CUTANEOUS | Status: DC | PRN

## 2015-02-01 MED ORDER — DIBUCAINE 1 % RE OINT: 1.0000 "application " | TOPICAL_OINTMENT | RECTAL | Status: DC | PRN

## 2015-02-01 MED ORDER — PRENATAL MULTIVITAMIN CH
1.0000 | ORAL_TABLET | Freq: Every day | ORAL | Status: DC
  Administered 2015-02-01 – 2015-02-02 (×2): 1 via ORAL
  Filled 2015-02-01 (×2): qty 1

## 2015-02-01 NOTE — Progress Notes (Signed)
POSTPARTUM PROGRESS NOTE  Post Partum Day 1  Subjective:  Georgana Maman-Alfa is a 19 y.o. G1P0 6433w0d s/p nsvd.  No acute events overnight.  Pt denies problems with ambulating, voiding or po intake.  She denies nausea or vomiting.  Pain is well controlled.  She has had flatus. She has not had bowel movement.  Lochia Small.   Objective: Blood pressure 125/66, pulse 88, temperature 98.7 F (37.1 C), temperature source Oral, resp. rate 20, height 5\' 6"  (1.676 m), weight 213 lb (96.616 kg), last menstrual period 04/27/2014, SpO2 98 %.  Physical Exam:  General: alert, cooperative and no distress Lochia:normal flow Chest: CTAB Heart: RRR no m/r/g Abdomen: +BS, soft, nontender,  Uterine Fundus: firm Vaginal packing removed, no significant bleeding DVT Evaluation: No calf swelling or tenderness Extremities: trace edema   Recent Labs  01/31/15 1548 02/01/15 0520  HGB 12.4 11.0*  HCT 35.6* 31.8*    Assessment/Plan:  ASSESSMENT: Mariaelena Maman-Alfa is a 19 y.o. G1P0 5733w0d s/p nsvd, doing well. 2nd degree perineal and bilateral sulcal lacerations, packing removed this morning, bleeding appropriate.  Plan for discharge tomorrow   LOS: 1 day   Silvano Bilisoah B Ary Lavine 02/01/2015, 6:37 AM

## 2015-02-01 NOTE — Progress Notes (Signed)
UR chart review completed.  

## 2015-02-01 NOTE — Lactation Note (Signed)
This note was copied from the chart of Wanda Meah Matthews. Lactation Consultation Note  Patient Name: Wanda Matthews ZOXWR'UToday's Date: 02/01/2015 Reason for consult: Initial assessment Baby at 17 hr of life and mom reports she is not making nor is the baby latching well. She has been offering a pacifier and her sisters told her to offer formula with a bottle. She had a Harmony and #24 NS in the room. She stated that she does not how to use either one. Suggested the she try to latch baby without the shield. Baby did well in the L breast, L nipple does become erect with stimulation. Baby was unable to latch to the R nipple, the R breast is less compressible. Showed mom how to apply the NS and reviewed NS care. Reviewed the Harmony with her and she stated that it hurts so she does not want to use it right now. Discussed feeding frequency, baby behavior, voids, wt loss, breast changes, manual expression, and nipple care. Given lactation handouts. She is aware of OP services and support group. Given LC phone number for help with latch to the R breast at the next feeding.     Maternal Data Formula Feeding for Exclusion: No Has patient been taught Hand Expression?: Yes Does the patient have breastfeeding experience prior to this delivery?: No  Feeding Feeding Type: Breast Fed Length of feed: 10 min  LATCH Score/Interventions Latch: Repeated attempts needed to sustain latch, nipple held in mouth throughout feeding, stimulation needed to elicit sucking reflex. Intervention(s): Adjust position;Assist with latch  Audible Swallowing: A few with stimulation Intervention(s): Hand expression Intervention(s): Skin to skin  Type of Nipple: Flat Intervention(s): Hand pump  Comfort (Breast/Nipple): Soft / non-tender     Hold (Positioning): Assistance needed to correctly position infant at breast and maintain latch. Intervention(s): Support Pillows;Position options  LATCH Score:  6  Lactation Tools Discussed/Used WIC Program: Yes   Consult Status Consult Status: Follow-up Date: 02/02/15 Follow-up type: In-patient    [name] 02/01/2015, 4:19 PM

## 2015-02-02 MED ORDER — ACETAMINOPHEN 325 MG PO TABS: 650.0000 mg | ORAL_TABLET | ORAL | Status: DC | PRN

## 2015-02-02 MED ORDER — IBUPROFEN 600 MG PO TABS: 600.0000 mg | ORAL_TABLET | Freq: Four times a day (QID) | ORAL | Status: DC | PRN

## 2015-02-02 NOTE — Lactation Note (Signed)
This note was copied from the chart of Wanda Tyrisha Matthews. Lactation Consultation Note  Patient Name: Wanda Matthews ZOXWR'UToday's Date: 02/02/2015   Visited with Mom on day of discharge, baby 836 hrs old. Mom feeding baby formula by bottle, but says she does offer the breast.  Nipple on right breast flat, and inverts when breast sandwiched.  Offered assistance with latching, states her nurse helped her and she knows what to do.  Talked about volume parameters to offer baby.  Mom had a nipple shield, but she lost it.  Gave another 24 mm nipple shield, and demonstrated how to apply it properly to pull nipple into shield.  Recommended using manual pump prior to latching and manual breast expression.  Gave Mom inverted nipple shells with instructions to wear until milk supply in.  Offered to make an OP lactation appt., but [facility]  Reminded her of support groups.  Engorgement prevention and treatment discussed.  To call prn.       Judee ClaraSmith, Miller Limehouse E 02/02/2015, 11:33 AM

## 2015-02-02 NOTE — Discharge Instructions (Signed)

## 2015-02-02 NOTE — Discharge Summary (Signed)
Obstetric Discharge Summary Reason for Admission: onset of labor Prenatal Procedures: none Intrapartum Procedures: spontaneous vaginal delivery with modified ritgen maneuver Postpartum Procedures: vaginal packing that was removed 8 hours post-partum Complications-Operative and Postpartum: Left through and through labial that was repaired with 4-0 vicryl. Bilateral sulcal and second degree perineal. The left sulcal laceration was noted to extend deeply into the vagina. Rectal exam performed noting no external anal sphincter or mucosal defect. The above were repaired with 3-0 vicryl. Vaginal packing placed with plan to remove in approximately 6-8 hours. HEMOGLOBIN  Date Value Ref Range Status  02/01/2015 11.0* 12.0 - 15.0 g/dL Final   HCT  Date Value Ref Range Status  02/01/2015 31.8* 36.0 - 46.0 % Final    Discharge Diagnoses: Term Pregnancy-delivered  Hospital Course:  Wanda Matthews is a 19 y.o. G1P1001 who presented with bloody mucous who progressed from 4.5cm to 6cm while in the MAU.  She had a SVD with complicated lacerations. She was able to ambulate, tolerate PO and void normally. She was discharged home with instructions for postpartum care.    Delivery Note Patient presented in labor and progressed without augmentation save for AROM when she was approximately 9 cm dilated.  Called to room as patient was completed dilated and pushing. During crowning, it was difficult to get good fetal heart readings. A fetal scalp electrode was placed and fetal HR was noted to be in the 50s. The patient was urged to push, and modified ritgen maneuver was utilized.  At 10:38 PM a viable female was delivered via Vaginal, Spontaneous Delivery (Presentation: ; Occiput Anterior). APGAR: 8, 9; weight 3260 g.  Placenta status: Intact, Spontaneous. Cord: 3 vessels with the following complications: None. Cord pH: unable to obtain.  Complicated lacerations noted. Left through and through labial  that was repaired with 4-0 vicryl. Bilateral sulcal and second degree perineal. The left sulcal laceration was noted to extend deeply into the vagina. Rectal exam performed noting no external anal sphincter or mucosal defect. The above were repaired with 3-0 vicryl. Vaginal packing placed with plan to remove in approximately 6-8 hours.  Anesthesia: Lidocaine, 2 doses of Fentanyl 100mcg Lacerations: see above Est. Blood Loss (mL): 400 mL  Mom to postpartum. Baby to Couplet care / Skin to Skin.  Physical Exam:  General: alert and cooperative Lochia: appropriate Uterine Fundus: firm DVT Evaluation: No evidence of DVT seen on physical exam.  Discharge Information: Date: 02/02/2015 Activity: pelvic rest Diet: routine Medications: PNV and Ibuprofen Baby feeding: plans to breastfeed, plans to bottle feed Contraception: Nexplanon Condition: stable Instructions: refer to practice specific booklet Discharge to: home   Newborn Data: Live born female  Birth Weight: 7 lb 3 oz (3260 g) APGAR: 8, 9  Home with mother.  Joanna Puffrystal S Dorsey, MD [facility] PGY-2 02/02/2015, 6:01 AM   OB fellow attestation I have seen and examined this patient and agree with above documentation in the resident's note.   Wanda Matthews is a 19 y.o. G1P1001 s/p NSVD.   Pain is well controlled.  Plan for birth control is Nexplanon.  Method of Feeding: Breast/Bottle  PE:  BP 102/58 mmHg  Pulse 82  Temp(Src) 98.5 F (36.9 C) (Oral)  Resp 18  Ht 5\' 6"  (1.676 m)  Wt 213 lb (96.616 kg)  BMI 34.40 kg/m2  SpO2 100%  LMP 04/27/2014  Breastfeeding? Unknown Gen: well appearing Heart: reg rate Lungs: normal WOB Fundus firm Ext: soft, no pain, no edema  Recent Labs  01/31/15 1548  02/01/15 0520  HGB 12.4 11.0*  HCT 35.6* 31.8*   Plan: discharge today - postpartum care discussed - f/u clinic in 6 weeks for postpartum visit  Federico Flake, MD 7:24 AM

## 2015-02-03 ENCOUNTER — Encounter: Payer: Self-pay | Admitting: Family Medicine

## 2015-02-04 ENCOUNTER — Encounter (HOSPITAL_COMMUNITY)
Admission: AD | Disposition: A | Discharge status: Home / Self Care | Payer: Self-pay | Source: Ambulatory Visit | Attending: Obstetrics and Gynecology

## 2015-02-04 ENCOUNTER — Inpatient Hospital Stay (HOSPITAL_COMMUNITY): Payer: Medicaid Other

## 2015-02-04 ENCOUNTER — Encounter (HOSPITAL_COMMUNITY): Payer: Self-pay

## 2015-02-04 ENCOUNTER — Inpatient Hospital Stay (HOSPITAL_COMMUNITY)
Admission: AD | Admit: 2015-02-04 | Discharge: 2015-02-05 | DRG: 769 | Disposition: A | Discharge status: Home / Self Care | Payer: Medicaid Other | Source: Ambulatory Visit | Attending: Obstetrics and Gynecology | Admitting: Obstetrics and Gynecology

## 2015-02-04 ENCOUNTER — Inpatient Hospital Stay (HOSPITAL_COMMUNITY): Payer: Medicaid Other | Admitting: Anesthesiology

## 2015-02-04 DIAGNOSIS — N719 Inflammatory disease of uterus, unspecified: Secondary | ICD-10-CM | POA: Diagnosis present

## 2015-02-04 DIAGNOSIS — O8612 Endometritis following delivery: Secondary | ICD-10-CM | POA: Diagnosis present

## 2015-02-04 DIAGNOSIS — O9089 Other complications of the puerperium, not elsewhere classified: Secondary | ICD-10-CM | POA: Diagnosis present

## 2015-02-04 HISTORY — PX: PERINEAL LACERATION REPAIR: SHX5389

## 2015-02-04 HISTORY — PX: DILATION AND CURETTAGE OF UTERUS: SHX78

## 2015-02-04 LAB — CBC WITH DIFFERENTIAL/PLATELET
BASOS ABS: 0 10*3/uL (ref 0.0–0.1)
Basophils Relative: 0 %
EOS ABS: 0.1 10*3/uL (ref 0.0–0.7)
EOS PCT: 1 %
HCT: 29 % — ABNORMAL LOW (ref 36.0–46.0)
HEMOGLOBIN: 9.8 g/dL — AB (ref 12.0–15.0)
Lymphocytes Relative: 18 %
Lymphs Abs: 1.5 10*3/uL (ref 0.7–4.0)
MCH: 30.4 pg (ref 26.0–34.0)
MCHC: 33.8 g/dL (ref 30.0–36.0)
MCV: 90.1 fL (ref 78.0–100.0)
MONO ABS: 0.5 10*3/uL (ref 0.1–1.0)
MONOS PCT: 6 %
Neutro Abs: 6.4 10*3/uL (ref 1.7–7.7)
Neutrophils Relative %: 75 %
PLATELETS: 157 10*3/uL (ref 150–400)
RBC: 3.22 MIL/uL — AB (ref 3.87–5.11)
RDW: 14.3 % (ref 11.5–15.5)
WBC: 8.6 10*3/uL (ref 4.0–10.5)

## 2015-02-04 LAB — TYPE AND SCREEN
ABO/RH(D): O POS
ANTIBODY SCREEN: NEGATIVE

## 2015-02-04 SURGERY — DILATION AND CURETTAGE
Anesthesia: General

## 2015-02-04 MED ORDER — ONDANSETRON HCL 4 MG PO TABS: 4.0000 mg | ORAL_TABLET | ORAL | Status: DC | PRN

## 2015-02-04 MED ORDER — LIDOCAINE HCL 1 % IJ SOLN
INTRAMUSCULAR | Status: AC
  Filled 2015-02-04: qty 20

## 2015-02-04 MED ORDER — SUCCINYLCHOLINE CHLORIDE 20 MG/ML IJ SOLN
INTRAMUSCULAR | Status: DC | PRN
  Administered 2015-02-04: 120 mg via INTRAVENOUS

## 2015-02-04 MED ORDER — SIMETHICONE 80 MG PO CHEW: 80.0000 mg | CHEWABLE_TABLET | ORAL | Status: DC | PRN

## 2015-02-04 MED ORDER — ACETAMINOPHEN 500 MG PO TABS
1000.0000 mg | ORAL_TABLET | Freq: Once | ORAL | Status: AC
  Administered 2015-02-04: 1000 mg via ORAL
  Filled 2015-02-04: qty 2

## 2015-02-04 MED ORDER — ONDANSETRON HCL 4 MG/2ML IJ SOLN: 4.0000 mg | INTRAMUSCULAR | Status: DC | PRN

## 2015-02-04 MED ORDER — DIPHENHYDRAMINE HCL 25 MG PO CAPS: 25.0000 mg | ORAL_CAPSULE | Freq: Four times a day (QID) | ORAL | Status: DC | PRN

## 2015-02-04 MED ORDER — SENNOSIDES-DOCUSATE SODIUM 8.6-50 MG PO TABS
2.0000 | ORAL_TABLET | ORAL | Status: DC
  Administered 2015-02-04: 2 via ORAL
  Filled 2015-02-04: qty 2

## 2015-02-04 MED ORDER — IBUPROFEN 600 MG PO TABS
600.0000 mg | ORAL_TABLET | Freq: Four times a day (QID) | ORAL | Status: DC | PRN
  Administered 2015-02-04: 600 mg via ORAL
  Filled 2015-02-04: qty 1

## 2015-02-04 MED ORDER — FENTANYL CITRATE (PF) 100 MCG/2ML IJ SOLN
INTRAMUSCULAR | Status: AC
  Filled 2015-02-04: qty 2

## 2015-02-04 MED ORDER — PROMETHAZINE HCL 25 MG/ML IJ SOLN: 6.2500 mg | INTRAMUSCULAR | Status: DC | PRN

## 2015-02-04 MED ORDER — SUCCINYLCHOLINE CHLORIDE 20 MG/ML IJ SOLN
INTRAMUSCULAR | Status: AC
  Filled 2015-02-04: qty 1

## 2015-02-04 MED ORDER — METOCLOPRAMIDE HCL 5 MG/ML IJ SOLN
INTRAMUSCULAR | Status: AC
  Filled 2015-02-04: qty 2

## 2015-02-04 MED ORDER — MIDAZOLAM HCL 2 MG/2ML IJ SOLN
INTRAMUSCULAR | Status: AC
  Filled 2015-02-04: qty 2

## 2015-02-04 MED ORDER — MIDAZOLAM HCL 2 MG/2ML IJ SOLN
INTRAMUSCULAR | Status: DC | PRN
  Administered 2015-02-04: 2 mg via INTRAVENOUS

## 2015-02-04 MED ORDER — PROPOFOL 10 MG/ML IV BOLUS
INTRAVENOUS | Status: DC | PRN
  Administered 2015-02-04: 50 mg via INTRAVENOUS
  Administered 2015-02-04: 200 mg via INTRAVENOUS

## 2015-02-04 MED ORDER — CEFAZOLIN SODIUM-DEXTROSE 2-3 GM-% IV SOLR
INTRAVENOUS | Status: AC
  Filled 2015-02-04: qty 50

## 2015-02-04 MED ORDER — AMOXICILLIN-POT CLAVULANATE 875-125 MG PO TABS
1.0000 | ORAL_TABLET | Freq: Once | ORAL | Status: AC
  Administered 2015-02-04: 1 via ORAL
  Filled 2015-02-04: qty 1

## 2015-02-04 MED ORDER — PRENATAL MULTIVITAMIN CH: 1.0000 | ORAL_TABLET | Freq: Every day | ORAL | Status: DC

## 2015-02-04 MED ORDER — LIDOCAINE HCL (CARDIAC) 20 MG/ML IV SOLN
INTRAVENOUS | Status: DC | PRN
  Administered 2015-02-04: 80 mg via INTRAVENOUS

## 2015-02-04 MED ORDER — AMOXICILLIN-POT CLAVULANATE 875-125 MG PO TABS
1.0000 | ORAL_TABLET | Freq: Two times a day (BID) | ORAL | Status: DC
Start: 2015-02-04 — End: 2015-02-05
  Administered 2015-02-04 – 2015-02-05 (×2): 1 via ORAL
  Filled 2015-02-04 (×2): qty 1

## 2015-02-04 MED ORDER — FENTANYL CITRATE (PF) 100 MCG/2ML IJ SOLN
INTRAMUSCULAR | Status: AC
Start: 2015-02-04 — End: 2015-02-04
  Filled 2015-02-04: qty 2

## 2015-02-04 MED ORDER — FENTANYL CITRATE (PF) 100 MCG/2ML IJ SOLN
25.0000 ug | INTRAMUSCULAR | Status: DC | PRN
  Administered 2015-02-04 (×2): 50 ug via INTRAVENOUS

## 2015-02-04 MED ORDER — METOCLOPRAMIDE HCL 5 MG/ML IJ SOLN
INTRAMUSCULAR | Status: DC | PRN
  Administered 2015-02-04: 10 mg via INTRAVENOUS

## 2015-02-04 MED ORDER — CITRIC ACID-SODIUM CITRATE 334-500 MG/5ML PO SOLN
30.0000 mL | Freq: Once | ORAL | Status: AC
  Administered 2015-02-04: 30 mL via ORAL
  Filled 2015-02-04: qty 15

## 2015-02-04 MED ORDER — LANOLIN HYDROUS EX OINT: TOPICAL_OINTMENT | CUTANEOUS | Status: DC | PRN

## 2015-02-04 MED ORDER — ACETAMINOPHEN 325 MG PO TABS: 650.0000 mg | ORAL_TABLET | Freq: Four times a day (QID) | ORAL | Status: DC | PRN

## 2015-02-04 MED ORDER — CEFAZOLIN SODIUM-DEXTROSE 2-3 GM-% IV SOLR
2.0000 g | Freq: Three times a day (TID) | INTRAVENOUS | Status: DC
  Administered 2015-02-04: 2 g via INTRAVENOUS
  Filled 2015-02-04: qty 50

## 2015-02-04 MED ORDER — BENZOCAINE-MENTHOL 20-0.5 % EX AERO
1.0000 "application " | INHALATION_SPRAY | CUTANEOUS | Status: DC | PRN
  Administered 2015-02-04: 1 via TOPICAL
  Filled 2015-02-04: qty 56

## 2015-02-04 MED ORDER — OXYCODONE HCL 5 MG PO TABS: 5.0000 mg | ORAL_TABLET | Freq: Four times a day (QID) | ORAL | Status: DC | PRN

## 2015-02-04 MED ORDER — FAMOTIDINE IN NACL 20-0.9 MG/50ML-% IV SOLN
20.0000 mg | Freq: Once | INTRAVENOUS | Status: AC
  Administered 2015-02-04: 20 mg via INTRAVENOUS
  Filled 2015-02-04: qty 50

## 2015-02-04 MED ORDER — ZOLPIDEM TARTRATE 5 MG PO TABS
5.0000 mg | ORAL_TABLET | Freq: Every evening | ORAL | Status: DC | PRN
  Administered 2015-02-04: 5 mg via ORAL
  Filled 2015-02-04: qty 1

## 2015-02-04 MED ORDER — PRENATAL 27-0.8 MG PO TABS
1.0000 | ORAL_TABLET | Freq: Every day | ORAL | Status: DC
  Filled 2015-02-04: qty 1

## 2015-02-04 MED ORDER — LACTATED RINGERS IV SOLN
INTRAVENOUS | Status: DC
  Administered 2015-02-04 (×2): via INTRAVENOUS

## 2015-02-04 MED ORDER — LIDOCAINE HCL 1 % IJ SOLN
INTRAMUSCULAR | Status: DC | PRN
  Administered 2015-02-04: 15 mL

## 2015-02-04 MED ORDER — WITCH HAZEL-GLYCERIN EX PADS: 1.0000 "application " | MEDICATED_PAD | CUTANEOUS | Status: DC | PRN

## 2015-02-04 MED ORDER — DEXAMETHASONE SODIUM PHOSPHATE 10 MG/ML IJ SOLN
INTRAMUSCULAR | Status: DC | PRN
Start: 2015-02-04 — End: 2015-02-04
  Administered 2015-02-04: 4 mg via INTRAVENOUS

## 2015-02-04 MED ORDER — ONDANSETRON HCL 4 MG/2ML IJ SOLN
INTRAMUSCULAR | Status: DC | PRN
  Administered 2015-02-04: 4 mg via INTRAVENOUS

## 2015-02-04 MED ORDER — PROPOFOL 10 MG/ML IV BOLUS
INTRAVENOUS | Status: AC
  Filled 2015-02-04: qty 20

## 2015-02-04 MED ORDER — DIBUCAINE 1 % RE OINT: 1.0000 "application " | TOPICAL_OINTMENT | RECTAL | Status: DC | PRN

## 2015-02-04 MED ORDER — DEXTROSE IN LACTATED RINGERS 5 % IV SOLN: INTRAVENOUS | Status: DC

## 2015-02-04 MED ORDER — FENTANYL CITRATE (PF) 100 MCG/2ML IJ SOLN
INTRAMUSCULAR | Status: DC | PRN
  Administered 2015-02-04 (×2): 100 ug via INTRAVENOUS

## 2015-02-04 SURGICAL SUPPLY — 15 items
CATH ROBINSON RED A/P 16FR (CATHETERS) ×3 IMPLANT
CLOTH BEACON ORANGE TIMEOUT ST (SAFETY) ×3 IMPLANT
DECANTER SPIKE VIAL GLASS SM (MISCELLANEOUS) ×3 IMPLANT
GLOVE BIOGEL PI IND STRL 7.0 (GLOVE) ×2 IMPLANT
GLOVE BIOGEL PI INDICATOR 7.0 (GLOVE) ×1
GLOVE ECLIPSE 9.0 STRL (GLOVE) ×6 IMPLANT
GOWN STRL REUS W/TWL 2XL LVL3 (GOWN DISPOSABLE) ×3 IMPLANT
GOWN STRL REUS W/TWL LRG LVL3 (GOWN DISPOSABLE) ×3 IMPLANT
PACK VAGINAL MINOR WOMEN LF (CUSTOM PROCEDURE TRAY) ×3 IMPLANT
PAD OB MATERNITY 4.3X12.25 (PERSONAL CARE ITEMS) ×3 IMPLANT
PAD PREP 24X48 CUFFED NSTRL (MISCELLANEOUS) ×3 IMPLANT
SET BERKELEY SUCTION TUBING (SUCTIONS) ×3 IMPLANT
SUT VIC AB 2-0 SH 27 (SUTURE) ×2
SUT VIC AB 2-0 SH 27XBRD (SUTURE) ×4 IMPLANT
TOWEL OR 17X24 6PK STRL BLUE (TOWEL DISPOSABLE) ×3 IMPLANT

## 2015-02-04 NOTE — MAU Note (Signed)
Pt had a vag delivery on 11/19. Had a repair. Today she said she was in the shower and felt something thought it was a string but now thinks it might be skin.  C/O increased pain and hurts to move. Pt also stated she thinks she has a fever.

## 2015-02-04 NOTE — Anesthesia Procedure Notes (Signed)
Procedure Name: Intubation Date/Time: 02/04/2015 5:42 PM Performed by: Shanon PayorGREGORY, Chrisopher Pustejovsky M Pre-anesthesia Checklist: Patient identified, Emergency Drugs available, Suction available, Patient being monitored and Timeout performed Patient Re-evaluated:Patient Re-evaluated prior to inductionOxygen Delivery Method: Circle system utilized Preoxygenation: Pre-oxygenation with 100% oxygen Intubation Type: IV induction Laryngoscope Size: Mac and 3 Grade View: Grade I Tube type: Oral Tube size: 7.0 mm Number of attempts: 1 Airway Equipment and Method: Stylet Placement Confirmation: ETT inserted through vocal cords under direct vision,  positive ETCO2 and breath sounds checked- equal and bilateral Secured at: 21 cm Tube secured with: Tape Dental Injury: Teeth and Oropharynx as per pre-operative assessment

## 2015-02-04 NOTE — Anesthesia Postprocedure Evaluation (Signed)
Anesthesia Post Note  Patient: Wanda Matthews  Procedure(s) Performed: Procedure(s) (LRB): DILATATION AND CURETTAGE (N/A) SUTURE REPAIR PERINEAL and CLITORAL LACERATIONS  Patient location during evaluation: PACU Anesthesia Type: General Level of consciousness: awake and alert Pain management: pain level controlled Vital Signs Assessment: post-procedure vital signs reviewed and stable Respiratory status: spontaneous breathing, nonlabored ventilation, respiratory function stable and patient connected to nasal cannula oxygen Cardiovascular status: blood pressure returned to baseline and stable Postop Assessment: No signs of nausea or vomiting Anesthetic complications: no    Last Vitals:  Filed Vitals:   02/04/15 1634 02/04/15 1826  BP: 124/63 129/75  Pulse: 123 121  Temp: 39.1 C 37.9 C  Resp: 18 25    Last Pain:  Filed Vitals:   02/04/15 1834  PainSc: 5                  Reino KentJudd, Dawnya Grams J

## 2015-02-04 NOTE — Anesthesia Preprocedure Evaluation (Signed)
Anesthesia Evaluation  Patient identified by MRN, date of birth, ID band Patient awake    Reviewed: Allergy & Precautions, H&P , Patient's Chart, lab work & pertinent test results  History of Anesthesia Complications Negative for: history of anesthetic complications  Airway Mallampati: II  TM Distance: >3 FB Neck ROM: full    Dental no notable dental hx.    Pulmonary neg pulmonary ROS,    Pulmonary exam normal breath sounds clear to auscultation       Cardiovascular negative cardio ROS Normal cardiovascular exam Rhythm:regular Rate:Normal     Neuro/Psych negative neurological ROS     GI/Hepatic negative GI ROS, Neg liver ROS,   Endo/Other  negative endocrine ROS  Renal/GU negative Renal ROS     Musculoskeletal   Abdominal   Peds  Hematology negative hematology ROS (+)   Anesthesia Other Findings Patient with light meal at 11 am, per perioperative national guidelines for elective surgery we will wait 6 hours for anesthesia to decrease risk of pulmonary aspiration  Reproductive/Obstetrics negative OB ROS                             Anesthesia Physical Anesthesia Plan  ASA: II  Anesthesia Plan: General   Post-op Pain Management:    Induction: Intravenous, Rapid sequence and Cricoid pressure planned  Airway Management Planned: Oral ETT  Additional Equipment:   Intra-op Plan:   Post-operative Plan: Extubation in OR  Informed Consent: I have reviewed the patients History and Physical, chart, labs and discussed the procedure including the risks, benefits and alternatives for the proposed anesthesia with the patient or authorized representative who has indicated his/her understanding and acceptance.   Dental Advisory Given  Plan Discussed with: Anesthesiologist, CRNA and Surgeon  Anesthesia Plan Comments:         Anesthesia Quick Evaluation

## 2015-02-04 NOTE — H&P (Signed)
History    CSN: [id number]  Arrival date and time: 02/04/15 1231   None    Chief Complaint  Patient presents with  . Postpartum Complications   HPI   Patient states that the she started having pain the previous night. She took some pain medication at night. The following morning she took a shower and noted a significant gel-like discharge. Patient then stared feeling feverish and had some chills. Patient denies any n/v, SOB or dysuria.  [facility] as well as hx of complicated lacerations noted. Left through and through labial that was repaired with 4-0 vicryl. Bilateral sulcal and second degree perineal. The left sulcal laceration was noted to extend deeply into the vagina. Rectal exam performed noting no external anal sphincter or mucosal defect. The above were repaired with 3-0 vicryl. Vaginal packing placed with plan to remove in approximately 6-8 hours.  OB History    Gravida Para Term Preterm AB TAB SAB Ectopic Multiple Living   1 1 1       0 1      Past Medical History  Diagnosis Date  . Medical history non-contributory     Past Surgical History  Procedure Laterality Date  . No past surgeries      History reviewed. No pertinent family history.  Social History  Substance Use Topics  . Smoking status: Never Smoker   . Smokeless tobacco: None  . Alcohol Use: No    Allergies: No Known Allergies  Prescriptions prior to admission  Medication Sig Dispense Refill Last Dose  . acetaminophen (TYLENOL) 325 MG tablet Take 2 tablets (650 mg total) by mouth every 4 (four) hours as needed (for pain scale < 4). 30 tablet 3   . calcium carbonate (TUMS - DOSED IN MG ELEMENTAL CALCIUM) 500 MG chewable tablet Chew 1 tablet by mouth daily as needed for indigestion or heartburn.   01/30/2015 at Unknown time  . ibuprofen (ADVIL,MOTRIN) 600 MG tablet Take 1 tablet (600 mg total) by mouth every 6 (six) hours as needed. 30  tablet 0   . Prenatal Vit-Fe Fumarate-FA (MULTIVITAMIN-PRENATAL) 27-0.8 MG TABS tablet Take 1 tablet by mouth daily at 12 noon. 30 each 11 01/30/2015 at Unknown time    ROS Physical Exam   Blood pressure 140/69, pulse 139, temperature 101.3 F (38.5 C), temperature source Oral, height 5\' 6"  (1.676 m), weight 206 lb (93.441 kg), unknown if currently breastfeeding.  Physical Exam  Constitutional: She is oriented to person, place, and time. She appears well-developed and well-nourished.  Cardiovascular: Normal rate, regular rhythm and intact distal pulses.  Exam reveals no gallop and no friction rub.   No murmur heard. Respiratory: Effort normal and breath sounds normal. No respiratory distress. She has no wheezes. She has no rales.  GI: Soft. Bowel sounds are normal. She exhibits no distension. There is tenderness. There is no rebound.  Suprapubic tenderness   Musculoskeletal: Normal range of motion. She exhibits no edema or tenderness.  Neurological: She is alert and oriented to person, place, and time.  GYN: Vaginal exam with malodorous products of conception extracted from cervical os, laceration repair intact.   [place] Pelvis Complete  02/04/2015  CLINICAL DATA:  Initial encounter for fever and pain after vaginal delivery 2 days ago. EXAM: TRANSABDOMINAL AND TRANSVAGINAL ULTRASOUND OF PELVIS TECHNIQUE: Both transabdominal and transvaginal ultrasound examinations of the pelvis were performed. Transabdominal technique was performed for global imaging of the pelvis including uterus, ovaries,  adnexal regions, and pelvic cul-de-sac. It was necessary to proceed with endovaginal exam following the transabdominal exam to visualize the endometrium. COMPARISON:  None FINDINGS: Uterus Measurements: 16.2 x 7.2 x 10.7 cm. No fibroids or other mass visualized. Endometrium Thickness: Endometrium is thickened and heterogeneous. The thickening appears more focal in the mid uterus and color Doppler evaluation  shows some flow signal within this focal area of thickening. Right ovary Measurements: 3.3 x 2.0 x 3.1 cm. Normal appearance/no adnexal mass. Left ovary Measurements: 4.2 x 2.2 x 2.7 cm. Normal appearance/no adnexal mass. Other findings No free fluid. IMPRESSION: Diffuse heterogeneity and thickening of the endometrial cavity which is more focal in the mid uterus. Color Doppler evaluation shows some flow signal in the region of focal thickening which raises concern for retained products of conception. Endometritis cannot be excluded by ultrasound. Electronically Signed   By: Kennith Center M.D.   On: 02/04/2015 14:03    Assessment and Plan   Patient admitted with a fever 101.3,  Suprapubic pain, with recent vaginal delivery on 11/20. Vagina exam positive for placental-looking products. Laceration repair were intact. Consider a differential of retained products of conception vs endometritis. However U/S positive for possible retained products.  - Started patient on Augmentin  - Will get  a D&C later on today in the OR for retained productions of conception   Wanda Matthews Wanda Matthews 02/04/2015, 12:56 PM

## 2015-02-04 NOTE — Transfer of Care (Signed)
Immediate Anesthesia Transfer of Care Note  Patient: Wanda Matthews  Procedure(s) Performed: Procedure(s): DILATATION AND CURETTAGE (N/A) SUTURE REPAIR PERINEAL and CLITORAL LACERATIONS  Patient Location: PACU  Anesthesia Type:General  Level of Consciousness: awake, alert  and oriented  Airway & Oxygen Therapy: Patient Spontanous Breathing and Patient connected to nasal cannula oxygen  Post-op Assessment: Report given to RN and Post -op Vital signs reviewed and stable  Post vital signs: Reviewed and stable  Last Vitals:  Filed Vitals:   02/04/15 1239 02/04/15 1634  BP: 140/69 124/63  Pulse: 139 123  Temp: 38.5 C 39.1 C  Resp:  18    Complications: No apparent anesthesia complications

## 2015-02-04 NOTE — Op Note (Signed)
Wanda Matthews  PROCEDURE DATE: 02/04/2015  PREOPERATIVE DIAGNOSIS: Retained products of conception, postpartum endometritis POSTOPERATIVE DIAGNOSIS: The same PROCEDURE:     Dilation and curettage; suture repair of left labial and perineal laceration SURGEON:  Dr. Christin BachJohn Ferguson, Dr. Shonna ChockNoah Pernell Lenoir  INDICATIONS: 19 y.o. G1P1001 who is PPD3 from an NSVD that was complicated by 2nd degree perineal laceration with bilateral deep sulcal laceration. She presented the day of surgery complaining of pelvic pain, foul-smelling discharge, and fever. SSE revealed products of conception in the vagina and foul-smelling discharge. Ultrasound showed thickened endometrium concerning for retained products of conception weeks gestation. Decision made to proceed to D and C. She received one dose of augmentin a few hours prior to the surgery and ancef 2 g IV just prior to the procedure. Risks of the procedure were discussed with the patient including but not limited to: bleeding which may require transfusion; infection which may require antibiotics; injury to uterus or surrounding organs; need for additional procedures including laparotomy or laparoscopy; possibility of intrauterine scarring which may impair future fertility; and other postoperative/anesthesia complications. Written informed consent was obtained.    FINDINGS:  A 16 week size uterus, moderate amounts of products of conception, specimen sent to pathology. Left labial laceration and perineal lacration  ANESTHESIA:    Monitored intravenous sedation, paracervical block. INTRAVENOUS FLUIDS:  1000 ml of LR ESTIMATED BLOOD LOSS:  50 ml SPECIMENS:  Products of conception sent to pathology COMPLICATIONS:  None immediate.  PROCEDURE DETAILS:  The patient received antibiotics as above. She was then taken to the operating room where general anesthesia was administered and was found to be adequate.  After an adequate timeout was performed, she was placed in the  dorsal lithotomy position and examined; then prepped and draped in the sterile manner.   Her bladder was in-and-out catheterized for an unmeasured amount of clear, yellow urine. A vaginal speculum was then placed in the patient's vagina and ring forceps were applied to the anterior lip of the cervix.  The uterus was sounded to approximately 12 cm. A paracervical block using 30 ml of 0.5% Marcaine was administered. The cervix did not require dilation. A sharp curettage was then performed and moderate amount of products of conception were removed. There was minimal bleeding noted.  All instruments were removed from the patient's vagina. Attention was then turned to repair of lacerations. The patient's left labial laceration was repaired with 2-0 vicryl, and the patient's perineal laceration was repaired with 2-0 vicryl.  Sponge and instrument counts were correct times two.  The patient tolerated the procedure well and was taken to the recovery area awake, and in stable condition.

## 2015-02-05 MED ORDER — HYDROCODONE-ACETAMINOPHEN 5-325 MG PO TABS: 1.0000 | ORAL_TABLET | Freq: Four times a day (QID) | ORAL | Status: DC | PRN

## 2015-02-05 MED ORDER — AMOXICILLIN-POT CLAVULANATE 875-125 MG PO TABS: 1.0000 | ORAL_TABLET | Freq: Two times a day (BID) | ORAL | Status: DC

## 2015-02-05 NOTE — Progress Notes (Signed)
Ambulated out teaching complete  

## 2015-02-05 NOTE — Discharge Summary (Signed)
Physician Discharge Summary  Patient ID: Wanda Matthews MRN: [id number] DOB/AGE: 05-08-1995 19 y.o.  Admit date: 02/04/2015 Discharge date: 02/05/2015  Admission Diagnoses:retained membranes ,postpartum endometritis   Discharge Diagnoses: same, resolved Active Problems:   * No active hospital problems. *   Discharged Condition: good  Hospital Course:   Chief Complaint  Patient presents with  . Postpartum Complications   HPI  Patient states that the she started having pain the previous night. She took some pain medication at night. The following morning she took a shower and noted a significant gel-like discharge. Patient then stared feeling feverish and had some chills. Patient denies any n/v, SOB or dysuria.  Patient with recent discharge from [facility] hospital after delivery on 11/20 as well as hx of complicated lacerations noted. Left through and through labial that was repaired with 4-0 vicryl. Bilateral sulcal and second degree perineal. The left sulcal laceration was noted to extend deeply into the vagina. Rectal exam performed noting no external anal sphincter or mucosal defect. The above were repaired with 3-0 vicryl. Vaginal packing placed with plan to remove in approximately 6-8 hours.     The patient went for D&C  PREOPERATIVE DIAGNOSIS: Retained products of conception, postpartum endometritis POSTOPERATIVE DIAGNOSIS: The same PROCEDURE: Dilation and curettage; suture repair of left labial and perineal laceration SURGEON: Dr. Christin Bach, Dr. Shonna Chock  INDICATIONS: 19 y.o. G1P1001 who is PPD3 from an NSVD that was complicated by 2nd degree perineal laceration with bilateral deep sulcal laceration. She presented the day of surgery complaining of pelvic pain, foul-smelling discharge, and fever. SSE revealed products of conception in the vagina and foul-smelling discharge. Ultrasound showed thickened endometrium concerning for retained products of  conception weeks gestation. Decision made to proceed to D and C. She received one dose of augmentin a few hours prior to the surgery and ancef 2 g IV just prior to the procedure  Procedure consisted of brief uterine curettage obtaining small amouts of decidua, no malodor.Ob lacerations were repaired  patient was afebrile postop, Consults: None  Significant Diagnostic Studies: labs:  CBC    Component Value Date/Time   WBC 8.6 02/04/2015 1512   RBC 3.22* 02/04/2015 1512   HGB 9.8* 02/04/2015 1512   HCT 29.0* 02/04/2015 1512   PLT 157 02/04/2015 1512   MCV 90.1 02/04/2015 1512   MCH 30.4 02/04/2015 1512   MCHC 33.8 02/04/2015 1512   RDW 14.3 02/04/2015 1512   LYMPHSABS 1.5 02/04/2015 1512   MONOABS 0.5 02/04/2015 1512   EOSABS 0.1 02/04/2015 1512   BASOSABS 0.0 02/04/2015 1512      Treatments: surgery: * PROCEDURE DETAILS: The patient received antibiotics as above. She was then taken to the operating room where general anesthesia was administered and was found to be adequate. After an adequate timeout was performed, she was placed in the dorsal lithotomy position and examined; then prepped and draped in the sterile manner. Her bladder was in-and-out catheterized for an unmeasured amount of clear, yellow urine. A vaginal speculum was then placed in the patient's vagina and ring forceps were applied to the anterior lip of the cervix. The uterus was sounded to approximately 12 cm. A paracervical block using 30 ml of 0.5% Marcaine was administered. The cervix did not require dilation. A sharp curettage was then performed and moderate amount of products of conception were removed. There was minimal bleeding noted. All instruments were removed from the patient's vagina. Attention was then turned to repair of lacerations. The patient's left labial  laceration was repaired with 2-0 vicryl, and the patient's perineal laceration was repaired with 2-0 vicryl. Sponge and instrument counts were  correct times two. The patient tolerated the procedure well and was taken to the recovery area awake, and in stable condition. **  Discharge Exam: Blood pressure 96/61, pulse 82, temperature 97.8 F (36.6 C), temperature source Oral, resp. rate 16, height 5\' 6"  (1.676 m), weight 93.441 kg (206 lb), SpO2 100 %, unknown if currently breastfeeding. General appearance: alert, cooperative and no distress Head: Normocephalic, without obvious abnormality, atraumatic GI: soft, non-tender; bowel sounds normal; no masses,  no organomegaly Pelvic: no further bleeding, able to void, no malodor Extremities: extremities normal, atraumatic, no cyanosis or edema  Disposition: 01-Home or Self Care  Discharge Instructions    Call MD for:  persistant nausea and vomiting    Complete by:  As directed      Call MD for:  severe uncontrolled pain    Complete by:  As directed      Call MD for:  temperature >100.4    Complete by:  As directed      Diet - low sodium heart healthy    Complete by:  As directed      Increase activity slowly    Complete by:  As directed             Medication List    TAKE these medications        acetaminophen 325 MG tablet  Commonly known as:  TYLENOL  Take 2 tablets (650 mg total) by mouth every 4 (four) hours as needed (for pain scale < 4).     amoxicillin-clavulanate 875-125 MG tablet  Commonly known as:  AUGMENTIN  Take 1 tablet by mouth every 12 (twelve) hours.     HYDROcodone-acetaminophen 5-325 MG tablet  Commonly known as:  NORCO/VICODIN  Take 1 tablet by mouth every 6 (six) hours as needed for moderate pain.     ibuprofen 600 MG tablet  Commonly known as:  ADVIL,MOTRIN  Take 1 tablet (600 mg total) by mouth every 6 (six) hours as needed.     multivitamin-prenatal 27-0.8 MG Tabs tablet  Take 1 tablet by mouth daily at 12 noon.           Follow-up Information    [facility]   Why:  postpartum visit   Contact  information:   [address] [place] [id number] [phone]       Signed: Tilda BurrowFERGUSON,Josedaniel Haye V 02/05/2015, 8:03 AM

## 2015-02-05 NOTE — Anesthesia Postprocedure Evaluation (Signed)
Anesthesia Post Note  Patient: Wanda Matthews  Procedure(s) Performed: Procedure(s) (LRB): DILATATION AND CURETTAGE (N/A) SUTURE REPAIR PERINEAL and CLITORAL LACERATIONS  Patient location during evaluation: [facility] Anesthesia Type: General Level of consciousness: awake and alert and oriented Vital Signs Assessment: post-procedure vital signs reviewed and stable Respiratory status: spontaneous breathing Cardiovascular status: stable Postop Assessment: No signs of nausea or vomiting and Adequate PO intake Anesthetic complications: no    Last Vitals:  Filed Vitals:   02/05/15 0136 02/05/15 0523  BP: 110/66 96/61  Pulse: 89 82  Temp: 36.4 C 36.6 C  Resp: 16 16    Last Pain:  Filed Vitals:   02/05/15 0630  PainSc: 0-No pain                 Elowyn Raupp

## 2015-02-05 NOTE — Addendum Note (Signed)
Addendum  created 02/05/15 [id number] by Jhonnie GarnerBeth M Shadiamond Koska, CRNA   Modules edited: Clinical Notes   Clinical Notes:  File: [id number]

## 2015-02-08 ENCOUNTER — Inpatient Hospital Stay (HOSPITAL_COMMUNITY): Admission: RE | Admit: 2015-02-08 | Payer: Self-pay | Source: Ambulatory Visit

## 2015-02-09 ENCOUNTER — Encounter (HOSPITAL_COMMUNITY): Payer: Self-pay | Admitting: Obstetrics and Gynecology

## 2015-02-09 ENCOUNTER — Telehealth: Payer: Self-pay | Admitting: Family Medicine

## 2015-02-09 ENCOUNTER — Encounter: Payer: Self-pay | Admitting: Family Medicine

## 2015-02-09 NOTE — Telephone Encounter (Signed)
Patient requesting Dr. Leonides Schanzorsey to write a letter stating that she had complications during her delivery in order to help expedite her mother getting a Visa to be able to come help pt with the new baby.

## 2015-02-09 NOTE — Telephone Encounter (Signed)
Please let the patient know that I wrote a note and it is a the front desk for pick up.   Thanks, Joanna Puffrystal S. Dorsey, MD [facility] Resident  02/09/2015, 4:27 PM\

## 2015-02-09 NOTE — Telephone Encounter (Signed)
Pt informed. Benford Asch, CMA  

## 2015-02-18 ENCOUNTER — Ambulatory Visit (INDEPENDENT_AMBULATORY_CARE_PROVIDER_SITE_OTHER): Payer: Self-pay | Admitting: Family Medicine

## 2015-02-18 ENCOUNTER — Encounter: Payer: Self-pay | Admitting: Family Medicine

## 2015-02-18 NOTE — Patient Instructions (Signed)
It was great to see you. Congratulations on you!

## 2015-02-18 NOTE — Progress Notes (Signed)
Patient ID: Wanda Matthews, female   DOB: Apr 11, 1995, 19 y.o.   MRN: [id number]    Subjective: CC: f/u D&C HPI: Patient is a 19 y.o. female presenting with a follow up after her delivery. She underwent a complicated vaginal delivery on 11/19 due to decreased fetal heart tones requiring ritgen's maneuver with bilateral sulcal, second degree perineal, and left through and through labial lacerations. Subsequently she underwent a D&C on 11/23 for retained membranes and postpartum endometritis.   She's been doing well since then. She is not having any vaginal bleeding or discharge. Her fevers subsided after the D&C. She denies any abdominal pain. Ms. Rhys MartiniMaman-Alfa is breastfeeding but noting some difficulty however she is going to stick with it and supplement [facility]   Social History: Never smoker  Health Maintenance: reportedly had flu vaccine 12/12/14 at pharmacy  ROS: All other systems reviewed and are negative.  Past Medical History Patient Active Problem List   Diagnosis Date Noted  . Routine postpartum follow-up 02/18/2015  . Active labor at term 01/31/2015  . Elevated glucose tolerance test 08/28/2014  . Supervision of normal first pregnancy in second trimester 07/22/2014    Medications- reviewed and updated Current Outpatient Prescriptions  Medication Sig Dispense Refill  . acetaminophen (TYLENOL) 325 MG tablet Take 2 tablets (650 mg total) by mouth every 4 (four) hours as needed (for pain scale < 4). (Patient not taking: Reported on 02/04/2015) 30 tablet 3  . amoxicillin-clavulanate (AUGMENTIN) 875-125 MG tablet Take 1 tablet by mouth every 12 (twelve) hours. 10 tablet 0  . HYDROcodone-acetaminophen (NORCO/VICODIN) 5-325 MG tablet Take 1 tablet by mouth every 6 (six) hours as needed for moderate pain. 20 tablet 0  . ibuprofen (ADVIL,MOTRIN) 600 MG tablet Take 1 tablet (600 mg total) by mouth every 6 (six) hours as needed. 30 tablet 0  . Prenatal Vit-Fe  Fumarate-FA (MULTIVITAMIN-PRENATAL) 27-0.8 MG TABS tablet Take 1 tablet by mouth daily at 12 noon. (Patient not taking: Reported on 02/04/2015) 30 each 11   No current facility-administered medications for this visit.    Objective: Office vital signs reviewed. BP 115/83 mmHg  Pulse 88  Temp(Src) 98.5 F (36.9 C) (Oral)  Ht 5\' 6"  (1.676 m)  Wt 195 lb 14.4 oz (88.86 kg)  BMI 31.63 kg/m2   Physical Examination:  General: Awake, alert, well- nourished, NAD Cardio: RRR, no m/r/g noted.  Pulm: No increased WOB.  CTAB, without wheezes, rhonchi or crackles noted.  GI: +BS, soft, NT/ND,+BS x4, no hepatomegaly, no splenomegaly  Assessment/Plan: Routine postpartum follow-up Ms. Matthews has been doing much better since her D&C: no abdominal pain, fevers, or vaginal bleeding/discharge. We discussed reasons to return to clinic: fevers, vaginal discharge/bleeding, chest pain, SOB, or LE swelling. [facility] She is continuing to breastfeed with supplementation with formula. - Discussed the importance of continuing to breast feed - patient to follow up with me in 4 weeks to ensure she's continuing to do well postpartum and acclimating appropriately.     No orders of the defined types were placed in this encounter.    No orders of the defined types were placed in this encounter.    Joanna Puffrystal S. Dorsey PGY-2, [facility]

## 2015-02-18 NOTE — Assessment & Plan Note (Signed)
[name] has been doing much better since her D&C: no abdominal pain, fevers, or vaginal bleeding/discharge. We discussed reasons to return to clinic: fevers, vaginal discharge/bleeding, chest pain, SOB, or LE swelling. [facility]. She is continuing to breastfeed with supplementation with formula. - Discussed the importance of continuing to breast feed - patient to follow up with me in 4 weeks to ensure she's continuing to do well postpartum and acclimating appropriately.

## 2015-02-28 ENCOUNTER — Emergency Department (HOSPITAL_COMMUNITY)
Admission: EM | Admit: 2015-02-28 | Discharge: 2015-02-28 | Disposition: A | Discharge status: Home / Self Care | Payer: Medicaid Other | Attending: Emergency Medicine | Admitting: Emergency Medicine

## 2015-02-28 ENCOUNTER — Encounter (HOSPITAL_COMMUNITY): Payer: Self-pay | Admitting: Emergency Medicine

## 2015-02-28 ENCOUNTER — Emergency Department (HOSPITAL_COMMUNITY): Payer: Medicaid Other

## 2015-02-28 DIAGNOSIS — R1011 Right upper quadrant pain: Secondary | ICD-10-CM

## 2015-02-28 DIAGNOSIS — Z792 Long term (current) use of antibiotics: Secondary | ICD-10-CM | POA: Insufficient documentation

## 2015-02-28 DIAGNOSIS — K8020 Calculus of gallbladder without cholecystitis without obstruction: Secondary | ICD-10-CM

## 2015-02-28 DIAGNOSIS — R300 Dysuria: Secondary | ICD-10-CM | POA: Insufficient documentation

## 2015-02-28 LAB — LIPASE, BLOOD: LIPASE: 30 U/L (ref 11–51)

## 2015-02-28 LAB — URINALYSIS, ROUTINE W REFLEX MICROSCOPIC
Bilirubin Urine: NEGATIVE
GLUCOSE, UA: NEGATIVE mg/dL
Hgb urine dipstick: NEGATIVE
Ketones, ur: NEGATIVE mg/dL
Nitrite: NEGATIVE
PH: 7.5 (ref 5.0–8.0)
PROTEIN: NEGATIVE mg/dL
Specific Gravity, Urine: 1.023 (ref 1.005–1.030)

## 2015-02-28 LAB — URINE MICROSCOPIC-ADD ON

## 2015-02-28 LAB — COMPREHENSIVE METABOLIC PANEL
ALT: 15 U/L (ref 14–54)
ANION GAP: 8 (ref 5–15)
AST: 19 U/L (ref 15–41)
Albumin: 3.4 g/dL — ABNORMAL LOW (ref 3.5–5.0)
Alkaline Phosphatase: 122 U/L (ref 38–126)
BUN: 8 mg/dL (ref 6–20)
CHLORIDE: 106 mmol/L (ref 101–111)
CO2: 25 mmol/L (ref 22–32)
Calcium: 9.2 mg/dL (ref 8.9–10.3)
Creatinine, Ser: 0.87 mg/dL (ref 0.44–1.00)
Glucose, Bld: 91 mg/dL (ref 65–99)
POTASSIUM: 3.5 mmol/L (ref 3.5–5.1)
Sodium: 139 mmol/L (ref 135–145)
Total Bilirubin: 0.5 mg/dL (ref 0.3–1.2)
Total Protein: 7 g/dL (ref 6.5–8.1)

## 2015-02-28 LAB — CBC
HEMATOCRIT: 34.3 % — AB (ref 36.0–46.0)
Hemoglobin: 11.7 g/dL — ABNORMAL LOW (ref 12.0–15.0)
MCH: 30.2 pg (ref 26.0–34.0)
MCHC: 34.1 g/dL (ref 30.0–36.0)
MCV: 88.6 fL (ref 78.0–100.0)
PLATELETS: 221 10*3/uL (ref 150–400)
RBC: 3.87 MIL/uL (ref 3.87–5.11)
RDW: 13.2 % (ref 11.5–15.5)
WBC: 6.4 10*3/uL (ref 4.0–10.5)

## 2015-02-28 NOTE — ED Notes (Signed)
Patient left at this time with all belongings. Refused wheelchair 

## 2015-02-28 NOTE — ED Notes (Signed)
Pt taken to US

## 2015-02-28 NOTE — Discharge Instructions (Signed)
Biliary Colic Biliary colic is a pain in the upper abdomen. The pain:  Is usually felt on the right side of the abdomen, but it may also be felt in the center of the abdomen, just below the breastbone (sternum).  May spread back toward the right shoulder blade.  May be steady or irregular.  May be accompanied by nausea and vomiting. Most of the time, the pain goes away in 1-5 hours. After the most intense pain passes, the abdomen may continue to ache mildly for about 24 hours. Biliary colic is caused by a blockage in the bile duct. The bile duct is a pathway that carries bile--a liquid that helps to digest fats--from the gallbladder to the small intestine. Biliary colic usually occurs after eating, when the digestive system demands bile. The pain develops when muscle cells contract forcefully to try to move the blockage so that bile can get by. HOME CARE INSTRUCTIONS  Take medicines only as directed by your health care provider.  Drink enough fluid to keep your urine clear or pale yellow.  Avoid fatty, greasy, and fried foods. These kinds of foods increase your body's demand for bile.  Avoid any foods that make your pain worse.  Avoid overeating.  Avoid having a large meal after fasting. SEEK MEDICAL CARE IF:  You develop a fever.  Your pain gets worse.  You vomit.  You develop nausea that prevents you from eating and drinking. SEEK IMMEDIATE MEDICAL CARE IF:  You suddenly develop a fever and shaking chills.  You develop a yellowish discoloration (jaundice) of:  Skin.  Whites of the eyes.  Mucous membranes.  You have continuous or severe pain that is not relieved with medicines.  You have nausea and vomiting that is not relieved with medicines.  You develop dizziness or you faint.   This information is not intended to replace advice given to you by your health care provider. Make sure you discuss any questions you have with your health care provider.   Document  Released: 08/01/2005 Document Revised: 07/15/2014 Document Reviewed: 12/10/2013 Elsevier Interactive Patient Education 2016 [employer]  Cholelithiasis Cholelithiasis (also called gallstones) is a form of gallbladder disease. The gallbladder is a small organ that helps you digest fats. Symptoms of gallstones are:  Feeling sick to your stomach (nausea).  Throwing up (vomiting).  Belly pain.  Yellowing of the skin (jaundice).  Sudden pain. You may feel the pain for minutes to hours.  Fever.  Pain to the touch. HOME CARE  Only take medicines as told by your doctor.  Eat a low-fat diet until you see your doctor again. Eating fat can result in pain.  Follow up with your doctor as told. Attacks usually happen time after time. Surgery is usually needed for permanent treatment. GET HELP RIGHT AWAY IF:   Your pain gets worse.  Your pain is not helped by medicines.  You have a fever and lasting symptoms for more than 2-3 days.  You have a fever and your symptoms suddenly get worse.  You keep feeling sick to your stomach and throwing up. MAKE SURE YOU:   Understand these instructions.  Will watch your condition.  Will get help right away if you are not doing well or get worse.   This information is not intended to replace advice given to you by your health care provider. Make sure you discuss any questions you have with your health care provider.   Follow-up with your primary care provider for reevaluation. Follow-up  with Gen. surgery for evaluation of bladder disease. Avoid fatty foods. Return to the emergency department if you expands worsening of her symptoms, severe abdominal pain, vomiting, fever, yellowing of your skin, shortness of breath.

## 2015-02-28 NOTE — ED Notes (Signed)
Pt sts RUQ pain starting today

## 2015-02-28 NOTE — ED Notes (Signed)
Pt c/o RUQ abdominal pain starting this afternoon after lunch. Attempted to make herself vomit to feel better but no change in pain. Pt reports pounding/pressure pain. States 7/10 pain. No diarrhea.   Refusing IV initially.

## 2015-02-28 NOTE — ED Provider Notes (Signed)
CSN: [id number]     Arrival date & time 02/28/15  1842 History   First MD Initiated Contact with Patient 02/28/15 1859     Chief Complaint  Patient presents with  . Abdominal Pain     (Consider location/radiation/quality/duration/timing/severity/associated sxs/prior Treatment) HPI  Wanda Matthews is a 19 y.o F who presents to teh ED today c/o RUQ abdominal pain. Pt had a vaginal delivery on 03/02/2015 and had emergency D&C 2 days later due to [facility] with infection. Today pt states that she ate [employer] and approximately 1 hour later she began having RUQ abdominal pain. Pain is 8/10 and constant. Does not radiate. Denies CP, syncope, SOB. Pt has had dysuria since her delivery. Denies hematuria, vaginal bleeding, vaginal discharge, fever. Pt has not been sexually active since her delivery.   Past Medical History  Diagnosis Date  . Medical history non-contributory    Past Surgical History  Procedure Laterality Date  . No past surgeries    . Dilation and curettage of uterus N/A 02/04/2015    Procedure: DILATATION AND CURETTAGE;  Surgeon: Tilda Burrow, MD;  Location: [facility];  Service: Gynecology;  Laterality: N/A;  . Perineal laceration repair  02/04/2015    Procedure: SUTURE REPAIR PERINEAL and CLITORAL LACERATIONS;  Surgeon: Tilda Burrow, MD;  Location: [facility];  Service: Gynecology;;   History reviewed. No pertinent family history. Social History  Substance Use Topics  . Smoking status: Never Smoker   . Smokeless tobacco: None  . Alcohol Use: No   OB History    Gravida Para Term Preterm AB TAB SAB Ectopic Multiple Living   0 1     Review of Systems  All other systems reviewed and are negative.     Allergies  Review of patient's allergies indicates no known allergies.  Home Medications   Prior to Admission medications   Medication Sig Start Date End Date Taking? Authorizing Provider  acetaminophen (TYLENOL) 325 MG tablet Take 2 tablets (650 mg  total) by mouth every 4 (four) hours as needed (for pain scale < 4). Patient not taking: Reported on 02/04/2015 02/02/15   Joanna Puff, MD  amoxicillin-clavulanate (AUGMENTIN) 875-125 MG tablet Take 1 tablet by mouth every 12 (twelve) hours. 02/05/15   Tilda Burrow, MD  HYDROcodone-acetaminophen (NORCO/VICODIN) 5-325 MG tablet Take 1 tablet by mouth every 6 (six) hours as needed for moderate pain. 02/05/15   Tilda Burrow, MD  ibuprofen (ADVIL,MOTRIN) 600 MG tablet Take 1 tablet (600 mg total) by mouth every 6 (six) hours as needed. 02/02/15   Joanna Puff, MD  Prenatal Vit-Fe Fumarate-FA (MULTIVITAMIN-PRENATAL) 27-0.8 MG TABS tablet Take 1 tablet by mouth daily at 12 noon. Patient not taking: Reported on 02/04/2015 07/22/14   Joanna Puff, MD   BP 107/62 mmHg  Pulse 86  Temp(Src) 98.1 F (36.7 C) (Oral)  Resp 18  SpO2 100% Physical Exam  Constitutional: She is oriented to person, place, and time. She appears well-developed and well-nourished. No distress.  HENT:  Head: Normocephalic and atraumatic.  Mouth/Throat: No oropharyngeal exudate.  Eyes: Conjunctivae and EOM are normal. Pupils are equal, round, and reactive to light. Right eye exhibits no discharge. Left eye exhibits no discharge. No scleral icterus.  Neck: Neck supple.  Cardiovascular: Normal rate, regular rhythm, normal heart sounds and intact distal pulses.  Exam reveals no gallop and no friction rub.   No murmur heard. Pulmonary/Chest: Effort normal and breath  sounds normal. No respiratory distress. She has no wheezes. She has no rales. She exhibits no tenderness.  Abdominal: Soft. She exhibits no distension and no mass. There is tenderness. There is no rebound and no guarding.  RUQ TTP. Negative Murphys sign. No peritoneal signs. No rigidity.   Musculoskeletal: Normal range of motion. She exhibits no edema.  Lymphadenopathy:    She has no cervical adenopathy.  Neurological: She is alert and oriented to  person, place, and time.  Strength 5/5 throughout. No sensory deficits.  No gait abnormality.  Skin: Skin is warm and dry. No rash noted. She is not diaphoretic. No erythema. No pallor.  Psychiatric: She has a normal mood and affect. Her behavior is normal.  Nursing note and vitals reviewed.   ED Course  Procedures (including critical care time) Labs Review Labs Reviewed  LIPASE, BLOOD  COMPREHENSIVE METABOLIC PANEL  CBC  URINALYSIS, ROUTINE W REFLEX MICROSCOPIC (NOT AT [facility])  I-STAT BETA HCG BLOOD, ED ([facility], [facility], [facility] ONLY)    Imaging Review Koreas Abdomen Limited Ruq  02/28/2015  CLINICAL DATA:  Right upper quadrant abdominal pain.  Onset today. EXAM: US ABDOMEN LIMITED - RIGHT UPPER QUADRANT COMPARISON:  None. FINDINGS: Gallbladder: Physiologically distended containing multiple gallstones. Borderline gallbladder wall thickening of 3 mm. No pericholecystic fluid. No sonographic Murphy sign noted. Common bile duct: Diameter: 3-4 mm. Liver: No focal lesion identified. Within normal limits in parenchymal echogenicity. Normal directional flow in the main portal vein. IMPRESSION: Cholelithiasis with borderline gallbladder wall thickening, however no sonographic Murphy's sign. HIDA scan could be considered if there is clinical concern for acute cholecystitis. No biliary dilatation. Electronically Signed   By: Rubye OaksMelanie  Ehinger M.D.   On: 02/28/2015 20:32   I have personally reviewed and evaluated these images and lab results as part of my medical decision-making.   EKG Interpretation None      MDM   Final diagnoses:  RUQ abdominal pain  Calculus of gallbladder without cholecystitis without obstruction    Otherwise healthy 19 y.o F presents with acute onset RUQ pain after eating [employer]. Pt is non-toxic appearing. Pain is still present in ED, but is subsiding. On exam, abdomen is soft, no rigidity. No peritoneal signs. Concern for gall bladder disease. Will order RUQ US. Patient's pain and  other symptoms adequately managed in emergency department. Patient does not meet the SIRS or Sepsis criteria. All lab work wnl.  US reveals cholelithiasis with borderline gallbladder wall thickening, however no sonographic Murphys sign is present. Recommend follow up HIDA scan.   On repeat exam patient does not have a surgical abdomen and there are no peritoneal signs.  No indication of appendicitis, bowel obstruction, bowel perforation, cholecystitis, diverticulitis, PID or ectopic pregnancy.  Patient discharged home with symptomatic treatment and given strict instructions for follow-up with their primary care physician as well as general surgery.  I have also discussed reasons to return immediately to the ER and encouraged pt to avoid fatty foods.  Patient expresses understanding and agrees with plan.       Lester KinsmanSamantha Tripp [place], PA-C 03/01/15 0258  Leta BaptistEmily Roe Nguyen, MD 03/01/15 [phone]

## 2015-03-04 ENCOUNTER — Ambulatory Visit (INDEPENDENT_AMBULATORY_CARE_PROVIDER_SITE_OTHER): Payer: Medicaid Other | Admitting: Family Medicine

## 2015-03-04 VITALS — BP 111/65 | HR 90 | Temp 98.0°F | Ht 66.0 in | Wt 198.0 lb

## 2015-03-04 DIAGNOSIS — R102 Pelvic and perineal pain: Secondary | ICD-10-CM

## 2015-03-04 NOTE — Patient Instructions (Addendum)
Your pain is left over from the large tear you had during childbirth. It is well healed and not infected but the scar tissue can still cause some pain. Massage it with water based lubricant or vaseline to soften the tissue. It will get better with time.

## 2015-03-05 NOTE — Assessment & Plan Note (Signed)
Residual pain from vaginal lac, well healed without infection, likely scar tissue - rec massage with vaseline or lube to soften tissue - will improve with time - f/u for worsening, bleeding, discharge

## 2015-03-05 NOTE — Progress Notes (Signed)
   Subjective:   Wanda Matthews is a 19 y.o. female with a history of recent vaginal delivery and postpartum d&c for retained products here for vaginal pain  Pt reports pain on the left side of her vagina since her delivery that has never gone away and seems like it should have to her. She is not having any bleeding or discharge and has not noticed any lesion in the area of pain. The stitches all fell out several weeks ago. The pain is worse with sitting. Vicodin helps with it but ibuprofen doesn't. On reviewing delivery note, she had a large right vaginal laceration as well as perineal lacs that required repair.  Review of Systems:  Per HPI. All other systems reviewed and are negative.   PMH, PSH, Medications, Allergies, and FmHx reviewed and updated in EMR.  Social History: never smoker  Objective:  BP 111/65 mmHg  Pulse 90  Temp(Src) 98 F (36.7 C) (Oral)  Ht 5\' 6"  (1.676 m)  Wt 198 lb (89.812 kg)  BMI 31.97 kg/m2  Gen:  19 y.o. female in NAD HEENT: NCAT, MMM, EOMI, PERRL, anicteric sclerae CV: RRR, no MRG, no JVD Resp: Non-labored, CTAB, no wheezes noted Abd: Soft, NTND, BS present, no guarding or organomegaly Ext: WWP, no edema MSK: Full ROM, strength intact Neuro: Alert and oriented, speech normal GU: Normal labia, urethra, vaginal canal. Small area of hard tissue on right (3 o'clock) labia minora that is tender to touch, no redness or swelling, looks like normal scar tissue      Chemistry      Component Value Date/Time   NA 139 02/28/2015 2000   K 3.5 02/28/2015 2000   CL 106 02/28/2015 2000   CO2 25 02/28/2015 2000   BUN 8 02/28/2015 2000   CREATININE 0.87 02/28/2015 2000      Component Value Date/Time   CALCIUM 9.2 02/28/2015 2000   ALKPHOS 122 02/28/2015 2000   AST 19 02/28/2015 2000   ALT 15 02/28/2015 2000   BILITOT 0.5 02/28/2015 2000      Lab Results  Component Value Date   WBC 6.4 02/28/2015   HGB 11.7* 02/28/2015   HCT 34.3* 02/28/2015   MCV 88.6 02/28/2015   PLT 221 02/28/2015   No results found for: TSH No results found for: HGBA1C Assessment & Plan:     Wanda Matthews is a 19 y.o. female here for vaginal pain  Vulvar pain Residual pain from vaginal lac, well healed without infection, likely scar tissue - rec massage with vaseline or lube to soften tissue - will improve with time - f/u for worsening, bleeding, discharge      Beverely LowElena Adamo, MD, MPH [facility] PGY-3 03/05/2015 4:51 PM

## 2015-03-19 ENCOUNTER — Ambulatory Visit: Payer: Medicaid Other | Admitting: Family Medicine

## 2015-03-25 ENCOUNTER — Ambulatory Visit (INDEPENDENT_AMBULATORY_CARE_PROVIDER_SITE_OTHER): Payer: Medicaid Other | Admitting: Family Medicine

## 2015-03-25 VITALS — BP 119/63 | HR 76 | Temp 98.1°F | Wt 201.4 lb

## 2015-03-25 DIAGNOSIS — R102 Pelvic and perineal pain: Secondary | ICD-10-CM

## 2015-03-25 NOTE — Patient Instructions (Signed)
I'm glad to see you're doing so well postpartum. You do not need to come back to see me. If you'd like to establish here as a regular patient, there are financial resources available.

## 2015-03-26 NOTE — Progress Notes (Signed)
Patient ID: Wanda Matthews, female   DOB: 03-27-1995, 20 y.o.   MRN: [id number]    Subjective: CC: f/u vulvar pain, would like to be released. HPI: Patient is a 20 y.o. female with a past medical history of recent vaginal delivery and postpartum D&C for retained products presenting to clinic today for f/u vulvar pain.  Patient was seen on 12/21 with left sided vulvar pain that was worse with sitting down. On exam she had a region of hard tissue on R at 3 o'clock on the labia minora that was tender to touch without signs of infection. Pain has since resolved. No stitches noted. No chest pain, SOB, or swelling. Doing well postpartum. Not sleeping as much as she'd like as the [name] went back out on the road last month (he's a truck driver). Getting some help from her sister. She's also taking classes. Continues to breastfeed with formula supplementation. Had Nexplanon placed at health department    Social History: Never smoker   ROS: All other systems reviewed and are negative.  Past Medical History Patient Active Problem List   Diagnosis Date Noted  . Vulvar pain 03/04/2015    Medications- reviewed and updated Current Outpatient Prescriptions  Medication Sig Dispense Refill  . HYDROcodone-acetaminophen (NORCO/VICODIN) 5-325 MG tablet Take 1 tablet by mouth every 6 (six) hours as needed for moderate pain. 20 tablet 0  . ibuprofen (ADVIL,MOTRIN) 600 MG tablet Take 1 tablet (600 mg total) by mouth every 6 (six) hours as needed. 30 tablet 0   No current facility-administered medications for this visit.    Objective: Office vital signs reviewed. BP 119/63 mmHg  Pulse 76  Temp(Src) 98.1 F (36.7 C) (Oral)  Wt 201 lb 6.4 oz (91.354 kg)   Physical Examination:  General: Awake, alert, well- nourished, NAD Cardio: RRR, no m/r/g noted. No JVD. No pitting edema noted in the LEs. Pulm: No increased WOB.  CTAB, without wheezes, rhonchi or crackles noted.  GI: soft, NT/ND,+BS x4, no  hepatomegaly, no splenomegaly Neuro: Strength and sensation grossly intact  Assessment/Plan: Vulvar pain Completely resolved. Patient doing well post-partum. Discussed no need to f/u in our clinic, however if she'd like to establish as a regular patient she could talk with the front desk about filling out the paperwork and meeting with financial services. Once again discussed routine post-partum care and signs and symptoms to look out for.    No orders of the defined types were placed in this encounter.    No orders of the defined types were placed in this encounter.    Joanna Puffrystal S. Vallerie Hentz PGY-2, [facility]

## 2015-03-26 NOTE — Assessment & Plan Note (Signed)
Completely resolved. Patient doing well post-partum. Discussed no need to f/u in our clinic, however if she'd like to establish as a regular patient she could talk with the front desk about filling out the paperwork and meeting with financial services. Once again discussed routine post-partum care and signs and symptoms to look out for.

## 2015-07-30 ENCOUNTER — Ambulatory Visit: Payer: Medicaid Other

## 2016-06-27 ENCOUNTER — Emergency Department (HOSPITAL_COMMUNITY)
Admission: EM | Admit: 2016-06-27 | Discharge: 2016-06-27 | Disposition: A | Discharge status: Home / Self Care | Payer: BLUE CROSS/BLUE SHIELD | Attending: Emergency Medicine | Admitting: Emergency Medicine

## 2016-06-27 ENCOUNTER — Emergency Department (HOSPITAL_COMMUNITY): Payer: BLUE CROSS/BLUE SHIELD

## 2016-06-27 ENCOUNTER — Encounter (HOSPITAL_COMMUNITY): Payer: Self-pay | Admitting: Emergency Medicine

## 2016-06-27 DIAGNOSIS — R1011 Right upper quadrant pain: Secondary | ICD-10-CM | POA: Diagnosis present

## 2016-06-27 DIAGNOSIS — K2900 Acute gastritis without bleeding: Secondary | ICD-10-CM | POA: Diagnosis not present

## 2016-06-27 LAB — CBC WITH DIFFERENTIAL/PLATELET
BASOS ABS: 0 10*3/uL (ref 0.0–0.1)
Basophils Relative: 0 %
EOS PCT: 3 %
Eosinophils Absolute: 0.2 10*3/uL (ref 0.0–0.7)
HCT: 38.3 % (ref 36.0–46.0)
Hemoglobin: 12.8 g/dL (ref 12.0–15.0)
LYMPHS PCT: 41 %
Lymphs Abs: 2.8 10*3/uL (ref 0.7–4.0)
MCH: 28 pg (ref 26.0–34.0)
MCHC: 33.4 g/dL (ref 30.0–36.0)
MCV: 83.8 fL (ref 78.0–100.0)
MONO ABS: 0.4 10*3/uL (ref 0.1–1.0)
MONOS PCT: 6 %
Neutro Abs: 3.5 10*3/uL (ref 1.7–7.7)
Neutrophils Relative %: 50 %
PLATELETS: 229 10*3/uL (ref 150–400)
RBC: 4.57 MIL/uL (ref 3.87–5.11)
RDW: 14 % (ref 11.5–15.5)
WBC: 6.8 10*3/uL (ref 4.0–10.5)

## 2016-06-27 LAB — I-STAT CHEM 8, ED
BUN: 10 mg/dL (ref 6–20)
CALCIUM ION: 1.15 mmol/L (ref 1.15–1.40)
Chloride: 106 mmol/L (ref 101–111)
Creatinine, Ser: 0.9 mg/dL (ref 0.44–1.00)
GLUCOSE: 82 mg/dL (ref 65–99)
HCT: 37 % (ref 36.0–46.0)
HEMOGLOBIN: 12.6 g/dL (ref 12.0–15.0)
Potassium: 3.7 mmol/L (ref 3.5–5.1)
Sodium: 140 mmol/L (ref 135–145)
TCO2: 26 mmol/L (ref 0–100)

## 2016-06-27 LAB — URINALYSIS, ROUTINE W REFLEX MICROSCOPIC
Bilirubin Urine: NEGATIVE
GLUCOSE, UA: NEGATIVE mg/dL
HGB URINE DIPSTICK: NEGATIVE
KETONES UR: NEGATIVE mg/dL
Leukocytes, UA: NEGATIVE
Nitrite: NEGATIVE
Protein, ur: NEGATIVE mg/dL
Specific Gravity, Urine: 1.019 (ref 1.005–1.030)
pH: 7 (ref 5.0–8.0)

## 2016-06-27 LAB — LIPASE, BLOOD: LIPASE: 22 U/L (ref 11–51)

## 2016-06-27 LAB — HEPATIC FUNCTION PANEL
ALT: 13 U/L — ABNORMAL LOW (ref 14–54)
AST: 16 U/L (ref 15–41)
Albumin: 3.5 g/dL (ref 3.5–5.0)
Alkaline Phosphatase: 116 U/L (ref 38–126)
TOTAL PROTEIN: 6.7 g/dL (ref 6.5–8.1)
Total Bilirubin: 0.3 mg/dL (ref 0.3–1.2)

## 2016-06-27 LAB — POC URINE PREG, ED: Preg Test, Ur: NEGATIVE

## 2016-06-27 MED ORDER — GI COCKTAIL ~~LOC~~
30.0000 mL | Freq: Once | ORAL | Status: AC
  Administered 2016-06-27: 30 mL via ORAL
  Filled 2016-06-27: qty 30

## 2016-06-27 MED ORDER — OMEPRAZOLE 20 MG PO CPDR: 20.0000 mg | DELAYED_RELEASE_CAPSULE | Freq: Every day | ORAL | 0 refills | Status: DC

## 2016-06-27 MED ORDER — DICYCLOMINE HCL 10 MG/ML IM SOLN
20.0000 mg | Freq: Once | INTRAMUSCULAR | Status: AC
  Administered 2016-06-27: 20 mg via INTRAMUSCULAR
  Filled 2016-06-27: qty 2

## 2016-06-27 NOTE — ED Provider Notes (Addendum)
[facility]-EMERGENCY DEPT Provider Note   CSN: [id number] Arrival date & time: 06/27/16  0119  By signing my name below, I, Majel Homer, attest that this documentation has been prepared under the direction and in the presence of Trixy Loyola, MD . Electronically Signed: Majel Homer, Scribe. 06/27/2016. 1:50 AM.  History   Chief Complaint Chief Complaint  Patient presents with  . Abdominal Pain   The history is provided by the patient. No language interpreter was used.  Abdominal Pain   This is a new problem. The current episode started 6 to 12 hours ago. The problem occurs constantly. The problem has not changed since onset.The pain is associated with eating. The pain is located in the RUQ. The quality of the pain is cramping. The pain is moderate. Pertinent negatives include fever, diarrhea, hematochezia, melena, vomiting, constipation and dysuria. Nothing aggravates the symptoms. Nothing relieves the symptoms. Past workup includes ultrasound. Her past medical history is significant for gallstones.   HPI Comments: Wanda Matthews is a 21 y.o. female who presents to the Emergency Department complaining of gradually worsening, RUQ abdominal pain. Pt reports her pain began soon after eating a "bad salad" containing hard-shelled tacos and believes this contributed to her symptoms. She notes she visited the ED on 02/28/15 for similar pain in which she was diagnosed with gallstones. She states she was told to follow-up with general surgery but notes she has not scheduled an appointment due to a lack of insurance. Pt reports the last exacerbation of similar pain occurred ~2 months ago. She denies vomiting, diarrhea, constipation, fevers, dysuria, and belching. She states her last normal menstrual period was last month.  Past Medical History:  Diagnosis Date  . Medical history non-contributory    Patient Active Problem List   Diagnosis Date Noted  . Vulvar pain 03/04/2015   Past Surgical History:   Procedure Laterality Date  . DILATION AND CURETTAGE OF UTERUS N/A 02/04/2015   Procedure: DILATATION AND CURETTAGE;  Surgeon: Tilda Burrow, MD;  Location: [facility];  Service: Gynecology;  Laterality: N/A;  . NO PAST SURGERIES    . PERINEAL LACERATION REPAIR  02/04/2015   Procedure: SUTURE REPAIR PERINEAL and CLITORAL LACERATIONS;  Surgeon: Tilda Burrow, MD;  Location: [facility];  Service: Gynecology;;    OB History    Gravida Para Term Preterm AB Living   SAB TAB Ectopic Multiple Live Births         0 1     Home Medications    Prior to Admission medications   Medication Sig Start Date End Date Taking? Authorizing Provider  HYDROcodone-acetaminophen (NORCO/VICODIN) 5-325 MG tablet Take 1 tablet by mouth every 6 (six) hours as needed for moderate pain. 02/05/15   Tilda Burrow, MD  ibuprofen (ADVIL,MOTRIN) 600 MG tablet Take 1 tablet (600 mg total) by mouth every 6 (six) hours as needed. 02/02/15   Joanna Puff, MD    Family History No family history on file.  Social History Social History  Substance Use Topics  . Smoking status: Never Smoker  . Smokeless tobacco: Not on file  . Alcohol use No   Allergies   Patient has no known allergies.  Review of Systems Review of Systems  Constitutional: Negative for fever.  Respiratory: Negative for chest tightness and shortness of breath.   Cardiovascular: Negative for chest pain, palpitations and leg swelling.  Gastrointestinal: Positive for abdominal pain. Negative for constipation, diarrhea, hematochezia,  melena and vomiting.  Genitourinary: Negative for dysuria.  All other systems reviewed and are negative.  Physical Exam Updated Vital Signs BP 118/73 (BP Location: Right Arm)   Pulse 87   Temp 98.4 F (36.9 C) (Oral)   Ht  (1.676 m)   Wt 214 lb (97.1 kg)   LMP 06/12/2016 (Approximate)   SpO2 100%   BMI 34.54 kg/m   Physical Exam  Constitutional: She is oriented to person, place, and time.  She appears well-developed and well-nourished. No distress.  HENT:  Head: Normocephalic and atraumatic.  Mouth/Throat: Oropharynx is clear and moist. No oropharyngeal exudate.  Moist mucous membranes, no exudate.  Eyes: Conjunctivae and EOM are normal. Pupils are equal, round, and reactive to light. Right eye exhibits no discharge. Left eye exhibits no discharge. No scleral icterus.  Neck: Normal range of motion. Neck supple. No JVD present. No tracheal deviation present.  Trachea is midline. No stridor or carotid bruits.   Cardiovascular: Normal rate, regular rhythm, normal heart sounds and intact distal pulses.   No murmur heard. Pulmonary/Chest: Effort normal and breath sounds normal. No stridor. No respiratory distress. She has no wheezes. She has no rales.  Lungs CTA bilaterally.  Abdominal: Soft. Bowel sounds are normal. She exhibits no distension and no mass. There is no tenderness. There is no rebound and no guarding.  Gassy throughout.  Musculoskeletal: Normal range of motion. She exhibits no edema, tenderness or deformity.  All compartments are soft. No palpable cords.   Lymphadenopathy:    She has no cervical adenopathy.  Neurological: She is alert and oriented to person, place, and time. She has normal reflexes. She displays normal reflexes. She exhibits normal muscle tone.  Skin: Skin is warm and dry. Capillary refill takes less than 2 seconds.  Psychiatric: She has a normal mood and affect. Her behavior is normal.  Nursing note and vitals reviewed.  ED Treatments / Results   Vitals:   06/27/16 0135 06/27/16 0410  BP: 118/73 116/74  Pulse: 87 (!) 55  Resp:  16  Temp: 98.4 F (36.9 C) 98.3 F (36.8 C)    DIAGNOSTIC STUDIES:  Oxygen Saturation is 100% on RA, normal by my interpretation.    COORDINATION OF CARE:  1:37 AM Discussed treatment plan with pt at bedside and pt agreed to plan.  Labs (all labs ordered are listed, but only abnormal results are  displayed) Results for orders placed or performed during the hospital encounter of 06/27/16  Urinalysis, Routine w reflex microscopic  Result Value Ref Range   Color, Urine YELLOW YELLOW   APPearance CLEAR CLEAR   Specific Gravity, Urine 1.019 1.005 - 1.030   pH 7.0 5.0 - 8.0   Glucose, UA NEGATIVE NEGATIVE mg/dL   Hgb urine dipstick NEGATIVE NEGATIVE   Bilirubin Urine NEGATIVE NEGATIVE   Ketones, ur NEGATIVE NEGATIVE mg/dL   Protein, ur NEGATIVE NEGATIVE mg/dL   Nitrite NEGATIVE NEGATIVE   Leukocytes, UA NEGATIVE NEGATIVE  CBC with Differential/Platelet  Result Value Ref Range   WBC 6.8 4.0 - 10.5 K/uL   RBC 4.57 3.87 - 5.11 MIL/uL   Hemoglobin 12.8 12.0 - 15.0 g/dL   HCT 16.1 09.6 - 04.5 %   MCV 83.8 78.0 - 100.0 fL   MCH 28.0 26.0 - 34.0 pg   MCHC 33.4 30.0 - 36.0 g/dL   RDW 40.9 81.1 - 91.4 %   Platelets 229 150 - 400 K/uL   Neutrophils Relative % 50 %   Neutro  Abs 3.5 1.7 - 7.7 K/uL   Lymphocytes Relative 41 %   Lymphs Abs 2.8 0.7 - 4.0 K/uL   Monocytes Relative 6 %   Monocytes Absolute 0.4 0.1 - 1.0 K/uL   Eosinophils Relative 3 %   Eosinophils Absolute 0.2 0.0 - 0.7 K/uL   Basophils Relative 0 %   Basophils Absolute 0.0 0.0 - 0.1 K/uL  Hepatic function panel  Result Value Ref Range   Total Protein 6.7 6.5 - 8.1 g/dL   Albumin 3.5 3.5 - 5.0 g/dL   AST 16 15 - 41 U/L   ALT 13 (L) 14 - 54 U/L   Alkaline Phosphatase 116 38 - 126 U/L   Total Bilirubin 0.3 0.3 - 1.2 mg/dL   Bilirubin, Direct <4.0 (L) 0.1 - 0.5 mg/dL   Indirect Bilirubin NOT CALCULATED 0.3 - 0.9 mg/dL  Lipase, blood  Result Value Ref Range   Lipase 22 11 - 51 U/L  POC Urine Pregnancy, ED (do NOT order at [facility])  Result Value Ref Range   Preg Test, Ur NEGATIVE NEGATIVE  I-Stat Chem 8, ED  Result Value Ref Range   Sodium 140 135 - 145 mmol/L   Potassium 3.7 3.5 - 5.1 mmol/L   Chloride 106 101 - 111 mmol/L   BUN 10 6 - 20 mg/dL   Creatinine, Ser 9.81 0.44 - 1.00 mg/dL   Glucose, Bld 82 65 - 99  mg/dL   Calcium, Ion 1.91 4.78 - 1.40 mmol/L   TCO2 26 0 - 100 mmol/L   Hemoglobin 12.6 12.0 - 15.0 g/dL   HCT 29.5 62.1 - 30.8 %   US Abdomen Limited  Result Date: 06/27/2016 CLINICAL DATA:  Right upper quadrant pain EXAM: US ABDOMEN LIMITED - RIGHT UPPER QUADRANT COMPARISON:  Abdominal ultrasound 02/28/2015 with a FINDINGS: Gallbladder: There are multiple stones within the gallbladder ; however, the number is decreased from the prior study. No positive sonographic Eulah Pont sign was demonstrated by the sonographer. There is no gallbladder wall thickening or pericholecystic fluid. Common bile duct: Diameter: 4.1 mm Liver: No focal lesion identified. Within normal limits in parenchymal echogenicity. IMPRESSION: Cholelithiasis without other evidence of acute cholecystitis. Electronically Signed   By: Deatra Robinson M.D.   On: 06/27/2016 03:28    Radiology No results found.  Procedures Procedures (including critical care time)  Medications Ordered in ED  Medications  gi cocktail (Maalox,Lidocaine,Donnatal) (30 mLs Oral Given 06/27/16 0213)  dicyclomine (BENTYL) injection 20 mg (20 mg Intramuscular Given 06/27/16 0213)     Final Clinical Impressions(s) / ED Diagnoses  Gastritis:  Exam, and vitals are benign and reassuring.No tenderness all labs are normal.  Return for fevers, worsening pain, chest pain shortness of breath or or any concerns.  Follow up with your pmd and surgery for elective GB removal.   After history, exam, and medical workup I feel the patient has been appropriately medically screened and is safe for discharge home. Pertinent diagnoses were discussed with the patient. Patient was given return precautions. New Prescriptions New Prescriptions   No medications on file  I personally performed the services described in this documentation, which was scribed in my presence. The recorded information has been reviewed and is accurate.      Cy Blamer, MD 06/27/16 0536     Adelynne Joerger, MD 06/27/16 [phone]

## 2016-06-27 NOTE — ED Triage Notes (Signed)
Pt arrived via pov c/o upper epigastric pain and RUQ pain rated at 5/10 at this time. Pt states pain started earlier today after eating a taco salad. Pt states this problem has been intermittent for past 3 months. Pt states pain starts out sharp and gradually becomes a nagging, achy-type of discomfort before dissipating and going away. Pt was seen in ED approximately 1 year ago, per pt, and advised to follow up for possible surgery but hasn't done so as of yet.

## 2017-12-04 ENCOUNTER — Telehealth: Payer: Self-pay | Admitting: Family Medicine

## 2017-12-04 NOTE — Telephone Encounter (Signed)
Pt gets free physicals from school.  She will sign a ROI and have them fax notes/. Jerico Grisso, Maryjo RochesterJessica Dawn, CMA

## 2017-12-04 NOTE — Telephone Encounter (Signed)
Left message for pt in regards to health maintenance check up.  °

## 2018-01-01 LAB — OB RESULTS CONSOLE HEPATITIS B SURFACE ANTIGEN: Hepatitis B Surface Ag: NEGATIVE

## 2018-01-01 LAB — OB RESULTS CONSOLE HIV ANTIBODY (ROUTINE TESTING): HIV: NONREACTIVE

## 2018-01-01 LAB — OB RESULTS CONSOLE RPR: RPR: NONREACTIVE

## 2018-01-01 LAB — OB RESULTS CONSOLE GC/CHLAMYDIA: Gonorrhea: NEGATIVE

## 2018-01-01 LAB — OB RESULTS CONSOLE RUBELLA ANTIBODY, IGM: Rubella: IMMUNE

## 2018-03-14 NOTE — L&D Delivery Note (Signed)
Pt was admitted in labor. She had AROM with clear fluid. She had pit aug added. She completed the first stage without difficulties. She pushed for 15 min. She had a SVD of one live viable female infant in the ROA position over a second degree midline tear. Placenta-S/I. EBL-400cc Tear closed with 3-0 chromic. Baby to [facility]

## 2018-06-20 LAB — OB RESULTS CONSOLE GBS
GBS: NEGATIVE
GBS: NEGATIVE

## 2018-07-13 ENCOUNTER — Encounter (HOSPITAL_COMMUNITY): Payer: Self-pay | Admitting: *Deleted

## 2018-07-13 ENCOUNTER — Inpatient Hospital Stay (EMERGENCY_DEPARTMENT_HOSPITAL)
Admission: AD | Admit: 2018-07-13 | Discharge: 2018-07-14 | Disposition: A | Discharge status: Home / Self Care | Payer: BLUE CROSS/BLUE SHIELD | Source: Home / Self Care | Attending: Obstetrics and Gynecology | Admitting: Obstetrics and Gynecology

## 2018-07-13 ENCOUNTER — Other Ambulatory Visit: Payer: Self-pay

## 2018-07-13 DIAGNOSIS — O471 False labor at or after 37 completed weeks of gestation: Secondary | ICD-10-CM | POA: Insufficient documentation

## 2018-07-13 DIAGNOSIS — Z3A40 40 weeks gestation of pregnancy: Secondary | ICD-10-CM | POA: Insufficient documentation

## 2018-07-13 DIAGNOSIS — O479 False labor, unspecified: Secondary | ICD-10-CM

## 2018-07-13 NOTE — MAU Note (Signed)
Having a lot of pelvic pressure tonight. Membranes stripped on Weds and was 5cm. Did not have a lot of pain with first baby until ready to deliver so wanted to be sure what is going on. Denies LOF or bleeding

## 2018-07-14 DIAGNOSIS — O479 False labor, unspecified: Secondary | ICD-10-CM | POA: Diagnosis not present

## 2018-07-14 DIAGNOSIS — Z3A40 40 weeks gestation of pregnancy: Secondary | ICD-10-CM | POA: Diagnosis not present

## 2018-07-14 NOTE — Discharge Instructions (Signed)
Reasons to return to MAU at [facility]:  1.  Contractions are  5 minutes apart or less, each last 1 minute, these have been going on for 1-2 hours, and you cannot walk or talk during them 2.  You have a large gush of fluid, or a trickle of fluid that will not stop and you have to wear a pad 3.  You have bleeding that is bright red, heavier than spotting--like menstrual bleeding (spotting can be normal in early labor or after a check of your cervix) 4.  You do not feel the baby moving like he/she normally does  

## 2018-07-14 NOTE — MAU Provider Note (Signed)
[name] is a G2P1001 at [redacted]w[redacted]d seen in MAU for labor. RN labor check, not seen by provider.   SVE by RN Dilation: 5 Effacement (%): 70 Cervical Position: Posterior Station: -3 Presentation: Vertex Exam by:: Camelia Eng RN  Cervix unchanged from previous exam  NST - FHR: 135 bpm / moderate variability / accels present / decels absent / TOCO: initially regular every 3-4 mins but became rare while pt in MAU  Plan:  D/C home with labor precautions Keep scheduled appt/NST with [facility] on 07/18/18  Sharen Counter, CNM  07/14/2018 1:07 AM

## 2018-07-14 NOTE — MAU Note (Signed)
I have communicated with Sharen Counter CNM and reviewed vital signs:  Vitals:   07/13/18 2312 07/13/18 2324  BP: 118/75 121/81  Pulse: 91 (!) 104  Resp:    Temp:      Vaginal exam:  Dilation: 5 Effacement (%): 70 Cervical Position: Posterior Station: -3 Presentation: Vertex Exam by:: Camelia Eng RN   Also reviewed contraction pattern and that non-stress test is reactive.  It has been documented that patient is contracting irregularly with no cervical change over 1.5 hours not indicating active labor.  Patient denies any other complaints. Patient has appointment scheduled for next week. Based on this report provider has given order for discharge.  A discharge order and diagnosis entered by a provider.   Labor discharge instructions reviewed with patient.

## 2018-07-15 ENCOUNTER — Other Ambulatory Visit: Payer: Self-pay

## 2018-07-15 ENCOUNTER — Inpatient Hospital Stay (HOSPITAL_COMMUNITY)
Admission: AD | Admit: 2018-07-15 | Discharge: 2018-07-16 | DRG: 807 | Disposition: A | Discharge status: Home / Self Care | Payer: BLUE CROSS/BLUE SHIELD | Attending: Obstetrics and Gynecology | Admitting: Obstetrics and Gynecology

## 2018-07-15 ENCOUNTER — Encounter (HOSPITAL_COMMUNITY): Payer: Self-pay | Admitting: *Deleted

## 2018-07-15 DIAGNOSIS — Z3A40 40 weeks gestation of pregnancy: Secondary | ICD-10-CM

## 2018-07-15 DIAGNOSIS — O26893 Other specified pregnancy related conditions, third trimester: Secondary | ICD-10-CM | POA: Diagnosis present

## 2018-07-15 DIAGNOSIS — Z3490 Encounter for supervision of normal pregnancy, unspecified, unspecified trimester: Secondary | ICD-10-CM

## 2018-07-15 LAB — CBC
HCT: 37.8 % (ref 36.0–46.0)
Hemoglobin: 13 g/dL (ref 12.0–15.0)
MCH: 30.4 pg (ref 26.0–34.0)
MCHC: 34.4 g/dL (ref 30.0–36.0)
MCV: 88.3 fL (ref 80.0–100.0)
Platelets: 140 10*3/uL — ABNORMAL LOW (ref 150–400)
RBC: 4.28 MIL/uL (ref 3.87–5.11)
RDW: 12.6 % (ref 11.5–15.5)
WBC: 5.4 10*3/uL (ref 4.0–10.5)
nRBC: 0 % (ref 0.0–0.2)

## 2018-07-15 LAB — ABO/RH: ABO/RH(D): O POS

## 2018-07-15 LAB — TYPE AND SCREEN
ABO/RH(D): O POS
Antibody Screen: NEGATIVE

## 2018-07-15 MED ORDER — MEASLES, MUMPS & RUBELLA VAC IJ SOLR: 0.5000 mL | Freq: Once | INTRAMUSCULAR | Status: DC

## 2018-07-15 MED ORDER — SENNOSIDES-DOCUSATE SODIUM 8.6-50 MG PO TABS
2.0000 | ORAL_TABLET | ORAL | Status: DC
  Administered 2018-07-16: 02:00:00 2 via ORAL
  Filled 2018-07-15: qty 2

## 2018-07-15 MED ORDER — ACETAMINOPHEN 325 MG PO TABS: 650.0000 mg | ORAL_TABLET | ORAL | Status: DC | PRN

## 2018-07-15 MED ORDER — FLEET ENEMA 7-19 GM/118ML RE ENEM: 1.0000 | ENEMA | RECTAL | Status: DC | PRN

## 2018-07-15 MED ORDER — IBUPROFEN 600 MG PO TABS
600.0000 mg | ORAL_TABLET | Freq: Four times a day (QID) | ORAL | Status: DC
  Administered 2018-07-15 – 2018-07-16 (×5): 600 mg via ORAL
  Filled 2018-07-15 (×5): qty 1

## 2018-07-15 MED ORDER — OXYTOCIN BOLUS FROM INFUSION
500.0000 mL | Freq: Once | INTRAVENOUS | Status: AC
  Administered 2018-07-15: 15:00:00 500 mL via INTRAVENOUS

## 2018-07-15 MED ORDER — SIMETHICONE 80 MG PO CHEW: 80.0000 mg | CHEWABLE_TABLET | ORAL | Status: DC | PRN

## 2018-07-15 MED ORDER — LACTATED RINGERS IV SOLN: 500.0000 mL | INTRAVENOUS | Status: DC | PRN

## 2018-07-15 MED ORDER — OXYCODONE-ACETAMINOPHEN 5-325 MG PO TABS: 2.0000 | ORAL_TABLET | ORAL | Status: DC | PRN

## 2018-07-15 MED ORDER — COCONUT OIL OIL: 1.0000 "application " | TOPICAL_OIL | Status: DC | PRN

## 2018-07-15 MED ORDER — BENZOCAINE-MENTHOL 20-0.5 % EX AERO
1.0000 "application " | INHALATION_SPRAY | CUTANEOUS | Status: DC | PRN
  Administered 2018-07-15: 1 via TOPICAL
  Filled 2018-07-15: qty 56

## 2018-07-15 MED ORDER — LIDOCAINE HCL (PF) 1 % IJ SOLN
30.0000 mL | INTRAMUSCULAR | Status: DC | PRN
  Administered 2018-07-15: 15:00:00 30 mL via SUBCUTANEOUS
  Filled 2018-07-15: qty 30

## 2018-07-15 MED ORDER — OXYTOCIN 40 UNITS IN NORMAL SALINE INFUSION - SIMPLE MED
2.5000 [IU]/h | INTRAVENOUS | Status: DC
  Administered 2018-07-15: 16:00:00 2.5 [IU]/h via INTRAVENOUS
  Filled 2018-07-15: qty 1000

## 2018-07-15 MED ORDER — ZOLPIDEM TARTRATE 5 MG PO TABS: 5.0000 mg | ORAL_TABLET | Freq: Every evening | ORAL | Status: DC | PRN

## 2018-07-15 MED ORDER — LACTATED RINGERS IV SOLN
INTRAVENOUS | Status: DC
  Administered 2018-07-15: 11:00:00 via INTRAVENOUS

## 2018-07-15 MED ORDER — ONDANSETRON HCL 4 MG/2ML IJ SOLN: 4.0000 mg | Freq: Four times a day (QID) | INTRAMUSCULAR | Status: DC | PRN

## 2018-07-15 MED ORDER — ONDANSETRON HCL 4 MG/2ML IJ SOLN: 4.0000 mg | INTRAMUSCULAR | Status: DC | PRN

## 2018-07-15 MED ORDER — SOD CITRATE-CITRIC ACID 500-334 MG/5ML PO SOLN: 30.0000 mL | ORAL | Status: DC | PRN

## 2018-07-15 MED ORDER — TERBUTALINE SULFATE 1 MG/ML IJ SOLN: 0.2500 mg | Freq: Once | INTRAMUSCULAR | Status: DC | PRN

## 2018-07-15 MED ORDER — TETANUS-DIPHTH-ACELL PERTUSSIS 5-2.5-18.5 LF-MCG/0.5 IM SUSP: 0.5000 mL | Freq: Once | INTRAMUSCULAR | Status: DC

## 2018-07-15 MED ORDER — OXYCODONE-ACETAMINOPHEN 5-325 MG PO TABS
1.0000 | ORAL_TABLET | ORAL | Status: DC | PRN
  Administered 2018-07-15: 16:00:00 1 via ORAL
  Filled 2018-07-15: qty 1

## 2018-07-15 MED ORDER — DIBUCAINE (PERIANAL) 1 % EX OINT: 1.0000 "application " | TOPICAL_OINTMENT | CUTANEOUS | Status: DC | PRN

## 2018-07-15 MED ORDER — WITCH HAZEL-GLYCERIN EX PADS: 1.0000 "application " | MEDICATED_PAD | CUTANEOUS | Status: DC | PRN

## 2018-07-15 MED ORDER — ONDANSETRON HCL 4 MG PO TABS: 4.0000 mg | ORAL_TABLET | ORAL | Status: DC | PRN

## 2018-07-15 MED ORDER — OXYTOCIN 40 UNITS IN NORMAL SALINE INFUSION - SIMPLE MED
1.0000 m[IU]/min | INTRAVENOUS | Status: DC
  Administered 2018-07-15: 14:00:00 2 m[IU]/min via INTRAVENOUS

## 2018-07-15 NOTE — Lactation Note (Signed)
This note was copied from a baby's chart. Lactation Consultation Note Baby 6 hrs old. Mom stated the baby came out BF great. Mom has a 24 yr old that she BF for 6 months. Mom denies painful latches or needs of assistance. Reviewed cluster feeding, STS, I&O. Encouraged mom to call for questions, concerns or assistance. Lactation brochure at bedside.  Patient Name: Wanda Matthews [id number] Date: 07/15/2018 Reason for consult: Initial assessment;Term   Maternal Data Has patient been taught Hand Expression?: Yes Does the patient have breastfeeding experience prior to this delivery?: Yes  Feeding Feeding Type: Breast Fed  LATCH Score                   Interventions Interventions: Breast feeding basics reviewed  Lactation Tools Discussed/Used WIC Program: No   Consult Status Consult Status: Follow-up Date: 07/16/18 Follow-up type: In-patient    Charyl Dancer 07/15/2018, 9:09 PM

## 2018-07-15 NOTE — MAU Note (Signed)
Pt states she started having contractions around 0700 denies LOF, some mucus that is blood tinged.  Denies complications with the pregnancy, reports good fetal movement.

## 2018-07-15 NOTE — H&P (Signed)
Wanda Matthews is an 23 y.o. G2P1001 [redacted]w[redacted]d black female who presents to the ER in labor. PNC was uncomplicated. The staff in the ER reports that the pt is 8 cm. GBS-neg/ FTS-wnl/ OGTT wnl  Past Medical History:  Diagnosis Date  . Medical history non-contributory     Past Surgical History:  Procedure Laterality Date  . DILATION AND CURETTAGE OF UTERUS N/A 02/04/2015   Procedure: DILATATION AND CURETTAGE;  Surgeon: Tilda Burrow, MD;  Location: [facility];  Service: Gynecology;  Laterality: N/A;  . PERINEAL LACERATION REPAIR  02/04/2015   Procedure: SUTURE REPAIR PERINEAL and CLITORAL LACERATIONS;  Surgeon: Tilda Burrow, MD;  Location: [facility];  Service: Gynecology;;    History reviewed. No pertinent family history. Social History:  reports that she has never smoked. She has never used smokeless tobacco. She reports that she does not drink alcohol or use drugs.  Allergies: No Known Allergies  Medications Prior to Admission  Medication Sig Dispense Refill  . calcium carbonate (TUMS - DOSED IN MG ELEMENTAL CALCIUM) 500 MG chewable tablet Chew 2 tablets by mouth daily.    Marland Kitchen omeprazole (PRILOSEC) 20 MG capsule Take 1 capsule (20 mg total) by mouth daily. 30 capsule 0  . Prenatal Vit-Fe Fumarate-FA (PRENATAL MULTIVITAMIN) TABS tablet Take 1 tablet by mouth daily at 12 noon.         Blood pressure 119/79, pulse 92, temperature 98.4 F (36.9 C), temperature source Oral, resp. rate 16, height 5\' 6"  (1.676 m), weight 97.1 kg, SpO2 100 %, unknown if currently breastfeeding. General appearance: alert and cooperative Abdomen gravid, non tender   Lab Results  Component Value Date   WBC 5.4 07/15/2018   HGB 13.0 07/15/2018   HCT 37.8 07/15/2018   MCV 88.3 07/15/2018   PLT 140 (L) 07/15/2018   Lab Results  Component Value Date   PREGTESTUR NEGATIVE 06/27/2016      Patient Active Problem List   Diagnosis Date Noted  . Indication for care in labor or delivery 07/15/2018  .  Vulvar pain 03/04/2015   IMP/ IUP at term in labor Plan/ Admit  Levi Aland 07/15/2018, 12:27 PM

## 2018-07-16 LAB — CBC
HCT: 32.4 % — ABNORMAL LOW (ref 36.0–46.0)
Hemoglobin: 11.1 g/dL — ABNORMAL LOW (ref 12.0–15.0)
MCH: 30.4 pg (ref 26.0–34.0)
MCHC: 34.3 g/dL (ref 30.0–36.0)
MCV: 88.8 fL (ref 80.0–100.0)
Platelets: 127 10*3/uL — ABNORMAL LOW (ref 150–400)
RBC: 3.65 MIL/uL — ABNORMAL LOW (ref 3.87–5.11)
RDW: 13 % (ref 11.5–15.5)
WBC: 9.8 10*3/uL (ref 4.0–10.5)
nRBC: 0 % (ref 0.0–0.2)

## 2018-07-16 LAB — RPR: RPR Ser Ql: NONREACTIVE

## 2018-07-16 NOTE — Lactation Note (Signed)
This note was copied from a baby's chart. Lactation Consultation Note  Patient Name: Boy Yoshimi Osbun [id number] Date: 07/16/2018 Reason for consult: Follow-up assessment Baby is 22 hours old.  Mom reports feedings are going well.  Baby is currently crying in his crib.  I handed baby to mom and she positioned baby in football hold.  Baby latched easily and well.  Audible swallows noted.  Discussed milk coming to volume and the prevention and treatment of engorgement.  Mom has a DEBP at home.  Questions answered.  Encouraged to call for assist prn.  Maternal Data    Feeding Feeding Type: Breast Fed  LATCH Score Latch: Grasps breast easily, tongue down, lips flanged, rhythmical sucking.  Audible Swallowing: Spontaneous and intermittent  Type of Nipple: Everted at rest and after stimulation  Comfort (Breast/Nipple): Soft / non-tender  Hold (Positioning): No assistance needed to correctly position infant at breast.  LATCH Score: 10  Interventions    Lactation Tools Discussed/Used     Consult Status Consult Status: Complete Follow-up type: Call as needed    Huston Foley 07/16/2018, 1:37 PM

## 2018-07-16 NOTE — Progress Notes (Signed)
Post Partum Day 1 Subjective: no complaints, up ad lib, voiding, tolerating PO and + flatus  Desires early discharge  Objective: Blood pressure 111/73, pulse 72, temperature 98.3 F (36.8 C), temperature source Oral, resp. rate 16, height 5\' 6"  (1.676 m), weight 97.1 kg, SpO2 100 %, unknown if currently breastfeeding.  Physical Exam:  General: alert, cooperative and appears stated age Lochia: appropriate Uterine Fundus: firm DVT Evaluation: No evidence of DVT seen on physical exam.  Recent Labs    07/15/18 1125 07/16/18 0457  HGB 13.0 11.1*  HCT 37.8 32.4*    Assessment/Plan: Discharge home if peds will discharge baby Breast and bottle feeding Desires neonatal circumcision, R/B/A of procedure discussed at length. Pt understands that neonatal circumcision is not considered medically necessary and is elective. The risks include, but are not limited to bleeding, infection, damage to the penis, development of scar tissue, and having to have it redone at a later date. Pt understands theses risks and wishes to proceed    LOS: 1 day   Waynard Reeds 07/16/2018, 12:00 PM

## 2018-07-16 NOTE — Discharge Summary (Signed)
Obstetric Discharge Summary Reason for Admission: onset of labor Prenatal Procedures: NST and ultrasound Intrapartum Procedures: spontaneous vaginal delivery Postpartum Procedures: none Complications-Operative and Postpartum: none Hemoglobin  Date Value Ref Range Status  07/16/2018 11.1 (L) 12.0 - 15.0 g/dL Final   HCT  Date Value Ref Range Status  07/16/2018 32.4 (L) 36.0 - 46.0 % Final    Physical Exam:  General: alert, cooperative and appears stated age 23: appropriate Uterine Fundus: firm DVT Evaluation: No evidence of DVT seen on physical exam.  Discharge Diagnoses: Term Pregnancy-delivered  Discharge Information: Date: 07/16/2018 Activity: pelvic rest Diet: routine Medications: Ibuprofen Condition: improved Instructions: refer to practice specific booklet Discharge to: home Follow-up Information    Levi Aland, MD Follow up in 4 week(s).   Specialty:  Obstetrics and Gynecology Why:  For a postpartum visit Contact information: [address] [place] [address] [phone]           Newborn Data: Live born female  Birth Weight: 8 lb 6 oz (3800 g) APGAR: 9, 9  Newborn Delivery   Birth date/time:  07/15/2018 15:01:00 Delivery type:  Vaginal, Spontaneous     Home with mother.  Waynard Reeds 07/16/2018, 12:06 PM

## 2019-07-02 ENCOUNTER — Encounter (HOSPITAL_COMMUNITY): Payer: Self-pay | Admitting: Obstetrics and Gynecology

## 2019-07-02 ENCOUNTER — Inpatient Hospital Stay (HOSPITAL_COMMUNITY): Payer: BLUE CROSS/BLUE SHIELD

## 2019-07-02 ENCOUNTER — Inpatient Hospital Stay (HOSPITAL_COMMUNITY)
Admission: AD | Admit: 2019-07-02 | Discharge: 2019-07-02 | Disposition: A | Discharge status: Home / Self Care | Payer: BLUE CROSS/BLUE SHIELD | Attending: Obstetrics and Gynecology | Admitting: Obstetrics and Gynecology

## 2019-07-02 ENCOUNTER — Other Ambulatory Visit: Payer: Self-pay

## 2019-07-02 DIAGNOSIS — Z3A10 10 weeks gestation of pregnancy: Secondary | ICD-10-CM | POA: Diagnosis not present

## 2019-07-02 DIAGNOSIS — O209 Hemorrhage in early pregnancy, unspecified: Secondary | ICD-10-CM | POA: Insufficient documentation

## 2019-07-02 DIAGNOSIS — O039 Complete or unspecified spontaneous abortion without complication: Secondary | ICD-10-CM

## 2019-07-02 LAB — CBC
HCT: 37.2 % (ref 36.0–46.0)
Hemoglobin: 12.5 g/dL (ref 12.0–15.0)
MCH: 28.6 pg (ref 26.0–34.0)
MCHC: 33.6 g/dL (ref 30.0–36.0)
MCV: 85.1 fL (ref 80.0–100.0)
Platelets: 228 10*3/uL (ref 150–400)
RBC: 4.37 MIL/uL (ref 3.87–5.11)
RDW: 13.2 % (ref 11.5–15.5)
WBC: 5.2 10*3/uL (ref 4.0–10.5)
nRBC: 0 % (ref 0.0–0.2)

## 2019-07-02 LAB — HCG, QUANTITATIVE, PREGNANCY: hCG, Beta Chain, Quant, S: 33037 m[IU]/mL — ABNORMAL HIGH (ref <5)

## 2019-07-02 NOTE — Discharge Instructions (Signed)
Miscarriage A miscarriage is the loss of an unborn baby (fetus) before the 20th week of pregnancy. Follow these instructions at home: Medicines   Take over-the-counter and prescription medicines only as told by your doctor.  If you were prescribed antibiotic medicine, take it as told by your doctor. Do not stop taking the antibiotic even if you start to feel better.  Do not take NSAIDs unless your doctor says that this is safe for you. NSAIDs include aspirin and ibuprofen. These medicines can cause bleeding. Activity  Rest as directed. Ask your doctor what activities are safe for you.  Have someone help you at home during this time. General instructions  Write down how many pads you use each day and how soaked they are.  Watch the amount of tissue or clumps of blood (blood clots) that you pass from your vagina. Save any large amounts of tissue for your doctor.  Do not use tampons, douche, or have sex until your doctor approves.  To help you and your partner with the process of grieving, talk with your doctor or seek counseling.  When you are ready, meet with your doctor to talk about steps you should take for your health. Also, talk with your doctor about steps to take to have a healthy pregnancy in the future.  Keep all follow-up visits as told by your doctor. This is important. Contact a doctor if:  You have a fever or chills.  You have vaginal discharge that smells bad.  You have more bleeding. Get help right away if:  You have very bad cramps or pain in your back or belly.  You pass clumps of blood that are walnut-sized or larger from your vagina.  You pass tissue that is walnut-sized or larger from your vagina.  You soak more than 1 regular pad in an hour.  You get light-headed or weak.  You faint (pass out).  You have feelings of sadness that do not go away, or you have thoughts of hurting yourself. Summary  A miscarriage is the loss of an unborn baby before  the 20th week of pregnancy.  Follow your doctor's instructions for home care. Keep all follow-up appointments.  To help you and your partner with the process of grieving, talk with your doctor or seek counseling. This information is not intended to replace advice given to you by your health care provider. Make sure you discuss any questions you have with your health care provider. Document Revised: 06/22/2018 Document Reviewed: 04/05/2016 Elsevier Patient Education  2020 [employer]  

## 2019-07-02 NOTE — MAU Note (Signed)
Still unable to pee, thinks it came out, passed a big glob and is bleeding heavier now.

## 2019-07-02 NOTE — MAU Note (Signed)
Around 0300, woke and saw bleeding.  Was concerned. Called her OB, was told to come here. 3 +HPT, has appt

## 2019-07-02 NOTE — MAU Provider Note (Signed)
History     CSN: [id number]  Arrival date and time: 07/02/19 1012   First Provider Initiated Contact with Patient 07/02/19 1413      Chief Complaint  Patient presents with  . Vaginal Bleeding   HPI Wanda Matthews is a 24 y.o. G3P2002 at [redacted]w[redacted]d who presents to MAU with chief complaint of vaginal bleeding. This is a new problem, onset this morning at 0300. Patient endorses continuous bleeding since that time and states she passed a large clot on arrival to MAU.  Patient denies pain, dysuria, abdominal tenderness, fever or recent illness. She is remote from intercourse.  Patient has received OB care for her previous pregnancies with [facility]. She has a New OB appointment with that practice on 07/15/2019.  OB History    Gravida  3   Para  2   Term  2   Preterm      AB      Living  2     SAB      TAB      Ectopic      Multiple  0   Live Births  2           Past Medical History:  Diagnosis Date  . Medical history non-contributory     Past Surgical History:  Procedure Laterality Date  . DILATION AND CURETTAGE OF UTERUS N/A 02/04/2015   Procedure: DILATATION AND CURETTAGE;  Surgeon: Tilda Burrow, MD;  Location: [facility];  Service: Gynecology;  Laterality: N/A;  . PERINEAL LACERATION REPAIR  02/04/2015   Procedure: SUTURE REPAIR PERINEAL and CLITORAL LACERATIONS;  Surgeon: Tilda Burrow, MD;  Location: [facility];  Service: Gynecology;;    History reviewed. No pertinent family history.  Social History   Tobacco Use  . Smoking status: Never Smoker  . Smokeless tobacco: Never Used  Substance Use Topics  . Alcohol use: No  . Drug use: No    Allergies: No Known Allergies  No medications prior to admission.    Review of Systems  Constitutional: Negative for fever.  Respiratory: Negative for shortness of breath.   Gastrointestinal: Negative for abdominal pain.  Genitourinary: Positive for vaginal bleeding.  Musculoskeletal: Negative  for back pain.  Neurological: Negative for dizziness, syncope, weakness and light-headedness.  All other systems reviewed and are negative.  Physical Exam   Blood pressure 130/64, pulse (!) 105, temperature 98.5 F (36.9 C), temperature source Oral, resp. rate 18, height 5\' 6"  (1.676 m), weight 98 kg, last menstrual period 04/22/2019, SpO2 100 %, unknown if currently breastfeeding.  Physical Exam  Nursing note and vitals reviewed. Constitutional: She is oriented to person, place, and time. She appears well-developed and well-nourished.  Cardiovascular: Normal rate and normal heart sounds.  Respiratory: Effort normal and breath sounds normal.  GI: Soft.  Genitourinary:    Uterus normal.     Vaginal discharge present.     Genitourinary Comments: See MDM notes   Neurological: She is alert and oriented to person, place, and time.  Skin: Skin is warm and dry.  Psychiatric: She has a normal mood and affect. Her behavior is normal. Judgment and thought content normal.    MAU Course/MDM  Procedures: speculum exam, transvaginal ultrasound  --Patient initially evaluated in triage due to lack of oopen rooms. Medically screened in triage and prepped for ultrasound --Phone call from ultrasonography reporting passage of large clot in ultrasound room. Provider at bedside for pelvic. Single 6cm clot visible at introitus. Gentle removed with  careful manipulation of speculum. Ultrasound completed. --Bleeding scant upon reexamination 30 minutes after ultrasound --Discussed workup with Dr. Kennon Rounds, who feels patient is appropriate for Cytotec for missed ab. Patient declined  Patient Vitals for the past 24 hrs:  BP Temp Temp src Pulse Resp SpO2 Height Weight  07/02/19 1440 107/69 -- -- 91 16 -- -- --  07/02/19 1044 130/64 98.5 F (36.9 C) Oral (!) 105 18 100 % 5\' 6"  (1.676 m) 98 kg   Results for orders placed or performed during the hospital encounter of 07/02/19 (from the past 24 hour(s))  CBC      Status: None   Collection Time: 07/02/19 12:22 PM  Result Value Ref Range   WBC 5.2 4.0 - 10.5 K/uL   RBC 4.37 3.87 - 5.11 MIL/uL   Hemoglobin 12.5 12.0 - 15.0 g/dL   HCT 37.2 36.0 - 46.0 %   MCV 85.1 80.0 - 100.0 fL   MCH 28.6 26.0 - 34.0 pg   MCHC 33.6 30.0 - 36.0 g/dL   RDW 13.2 11.5 - 15.5 %   Platelets 228 150 - 400 K/uL   nRBC 0.0 0.0 - 0.2 %  hCG, quantitative, pregnancy     Status: Abnormal   Collection Time: 07/02/19 12:22 PM  Result Value Ref Range   hCG, Beta Chain, Quant, S 33,037 (H) <5 mIU/mL   US OB LESS THAN 14 WEEKS WITH OB TRANSVAGINAL  Result Date: 07/02/2019 CLINICAL DATA:  At vaginal bleeding in first trimester of pregnancy, vaginal bleeding since 0300 hours, passed large clot in MA [place] EXAM: OBSTETRIC <14 WK [place] AND TRANSVAGINAL OB US TECHNIQUE: Both transabdominal and transvaginal ultrasound examinations were performed for complete evaluation of the gestation as well as the maternal uterus, adnexal regions, and pelvic cul-de-sac. Transvaginal technique was performed to assess early pregnancy. COMPARISON:  None FINDINGS: Intrauterine gestational sac: None identified Yolk sac:  N/A Embryo:  N/A Cardiac Activity: N/A Heart Rate: N/A  bpm MSD:   mm    w     d CRL:    mm    w    d                  [place] EDC: Subchorionic hemorrhage:  N/A Maternal uterus/adnexae: Uterus anteverted with mildly heterogeneous myometrial echogenicity. Large echogenic mass identified within the cervix on transabdominal images was passed before transvaginal imaging and is not seen on transvaginal images; this could represent a blood and/or products of conception, measured 6.7 x 3.6 x 5.5 cm. No uterine mass or gestational sac identified. Thickened heterogeneous endometrial complex 26 mm thick, question containing hemorrhage. LEFT ovary normal size and morphology 1.9 x 2.4 x 1.6 cm. RIGHT ovary normal size and morphology 2.4 x 1.9 x 1.4 cm. No free pelvic fluid or adnexal masses. IMPRESSION: No  intrauterine gestational sac identified; in the setting of a positive pregnancy test this could represent early intrauterine pregnancy too early to visualize, spontaneous abortion, or ectopic pregnancy. Patient passed a large echogenic "mass" between transabdominal and transvaginal imaging, could have represented blood and/or products of conception, measures 6.7 cm in greatest size. Residual thickened heterogeneous endometrial complex 26 mm thick questionably containing blood. Unremarkable ovaries and adnexa. Serial quantitative beta HCG and or follow-up ultrasound recommended to definitively exclude ectopic pregnancy. Electronically Signed   By: Lavonia Dana M.D.   On: 07/02/2019 13:56   Assessment and Plan  --24 y.o. G3P2002 at [redacted]w[redacted]d --Concern for miscarriage given certain LMP --Declined Cytotec --Blood type  O POS --Discharge home in stable condition  F/U: --Per MAU communication agreement, called [facility] office --CNM spoke with Leeroy Bock at [facility] who stated she would coordinate follow-up with Dr. Lowry Ram, CNM 07/02/2019, 4:36 PM

## 2020-10-16 LAB — HEPATITIS C ANTIBODY: HCV Ab: NEGATIVE

## 2020-10-16 LAB — OB RESULTS CONSOLE GC/CHLAMYDIA
Chlamydia: NEGATIVE
Gonorrhea: NEGATIVE

## 2020-10-16 LAB — OB RESULTS CONSOLE RUBELLA ANTIBODY, IGM: Rubella: IMMUNE

## 2020-10-16 LAB — OB RESULTS CONSOLE ABO/RH: RH Type: POSITIVE

## 2020-10-16 LAB — OB RESULTS CONSOLE ANTIBODY SCREEN: Antibody Screen: NEGATIVE

## 2020-10-16 LAB — OB RESULTS CONSOLE HIV ANTIBODY (ROUTINE TESTING): HIV: NONREACTIVE

## 2020-10-16 LAB — OB RESULTS CONSOLE RPR: RPR: NONREACTIVE

## 2020-10-16 LAB — OB RESULTS CONSOLE HEPATITIS B SURFACE ANTIGEN: Hepatitis B Surface Ag: NEGATIVE

## 2020-11-12 ENCOUNTER — Other Ambulatory Visit: Payer: Self-pay | Admitting: Obstetrics

## 2020-11-12 DIAGNOSIS — O99210 Obesity complicating pregnancy, unspecified trimester: Secondary | ICD-10-CM

## 2020-11-30 ENCOUNTER — Encounter: Payer: Self-pay | Admitting: *Deleted

## 2020-12-03 ENCOUNTER — Ambulatory Visit: Payer: Self-pay | Attending: Obstetrics

## 2020-12-03 ENCOUNTER — Ambulatory Visit: Payer: Self-pay | Admitting: *Deleted

## 2020-12-03 ENCOUNTER — Encounter: Payer: Self-pay | Admitting: *Deleted

## 2020-12-03 ENCOUNTER — Other Ambulatory Visit: Payer: Self-pay

## 2020-12-03 VITALS — BP 108/58 | HR 98

## 2020-12-03 DIAGNOSIS — O99210 Obesity complicating pregnancy, unspecified trimester: Secondary | ICD-10-CM | POA: Insufficient documentation

## 2020-12-03 DIAGNOSIS — Z6834 Body mass index (BMI) 34.0-34.9, adult: Secondary | ICD-10-CM | POA: Insufficient documentation

## 2020-12-03 DIAGNOSIS — Z3A20 20 weeks gestation of pregnancy: Secondary | ICD-10-CM

## 2020-12-03 DIAGNOSIS — O99212 Obesity complicating pregnancy, second trimester: Secondary | ICD-10-CM

## 2020-12-03 DIAGNOSIS — Z3689 Encounter for other specified antenatal screening: Secondary | ICD-10-CM

## 2020-12-03 DIAGNOSIS — E669 Obesity, unspecified: Secondary | ICD-10-CM

## 2020-12-03 DIAGNOSIS — O43192 Other malformation of placenta, second trimester: Secondary | ICD-10-CM

## 2020-12-04 ENCOUNTER — Other Ambulatory Visit: Payer: Self-pay | Admitting: *Deleted

## 2020-12-04 DIAGNOSIS — O43192 Other malformation of placenta, second trimester: Secondary | ICD-10-CM

## 2020-12-31 ENCOUNTER — Ambulatory Visit: Payer: Self-pay

## 2021-01-04 ENCOUNTER — Ambulatory Visit: Payer: Self-pay | Attending: Maternal & Fetal Medicine

## 2021-01-04 ENCOUNTER — Other Ambulatory Visit: Payer: Self-pay

## 2021-01-04 ENCOUNTER — Ambulatory Visit: Payer: Self-pay | Admitting: *Deleted

## 2021-01-04 ENCOUNTER — Encounter: Payer: Self-pay | Admitting: *Deleted

## 2021-01-04 VITALS — BP 110/61 | HR 108

## 2021-01-04 DIAGNOSIS — O99212 Obesity complicating pregnancy, second trimester: Secondary | ICD-10-CM

## 2021-01-04 DIAGNOSIS — O43192 Other malformation of placenta, second trimester: Secondary | ICD-10-CM | POA: Insufficient documentation

## 2021-01-04 DIAGNOSIS — Z3A24 24 weeks gestation of pregnancy: Secondary | ICD-10-CM

## 2021-01-04 DIAGNOSIS — E669 Obesity, unspecified: Secondary | ICD-10-CM

## 2021-01-04 DIAGNOSIS — O43199 Other malformation of placenta, unspecified trimester: Secondary | ICD-10-CM

## 2021-03-14 NOTE — L&D Delivery Note (Signed)
Patient was C/C/+3 and pushed for 1 minutes with epidural.    NSVD  female infant, Apgars 9,9, weight P.   The patient had no lacerations. Fundus was firm. EBL was expected amount. Placenta was delivered intact. Vagina was clear.  Delayed cord clamping done for 30-60 seconds while warming baby. Baby was vigorous and doing skin to skin with mother.  Wanda Matthews

## 2021-03-30 LAB — OB RESULTS CONSOLE GBS: GBS: NEGATIVE

## 2021-04-13 ENCOUNTER — Telehealth (HOSPITAL_COMMUNITY): Payer: Self-pay | Admitting: *Deleted

## 2021-04-13 NOTE — Telephone Encounter (Signed)
Preadmission screen  

## 2021-04-14 ENCOUNTER — Telehealth (HOSPITAL_COMMUNITY): Payer: Self-pay | Admitting: *Deleted

## 2021-04-14 ENCOUNTER — Encounter (HOSPITAL_COMMUNITY): Payer: Self-pay | Admitting: *Deleted

## 2021-04-14 NOTE — Telephone Encounter (Signed)
Preadmission screen  

## 2021-04-15 ENCOUNTER — Other Ambulatory Visit: Payer: Self-pay

## 2021-04-15 ENCOUNTER — Inpatient Hospital Stay (HOSPITAL_COMMUNITY)
Admission: AD | Admit: 2021-04-15 | Discharge: 2021-04-17 | DRG: 807 | Disposition: A | Discharge status: Home / Self Care | Payer: Medicaid Other | Attending: Obstetrics and Gynecology | Admitting: Obstetrics and Gynecology

## 2021-04-15 ENCOUNTER — Encounter (HOSPITAL_COMMUNITY): Payer: Self-pay | Admitting: Obstetrics and Gynecology

## 2021-04-15 DIAGNOSIS — O43123 Velamentous insertion of umbilical cord, third trimester: Principal | ICD-10-CM | POA: Diagnosis present

## 2021-04-15 DIAGNOSIS — Z3A39 39 weeks gestation of pregnancy: Secondary | ICD-10-CM

## 2021-04-15 DIAGNOSIS — Z20822 Contact with and (suspected) exposure to covid-19: Secondary | ICD-10-CM | POA: Diagnosis present

## 2021-04-15 DIAGNOSIS — Z3A40 40 weeks gestation of pregnancy: Secondary | ICD-10-CM

## 2021-04-15 NOTE — MAU Note (Signed)
STRONG UC SINCE 6 IN OFFICE YESTERDAY 1-2 CM GBS- NEG DENIES HSV

## 2021-04-16 ENCOUNTER — Encounter (HOSPITAL_COMMUNITY): Payer: Self-pay | Admitting: Obstetrics and Gynecology

## 2021-04-16 ENCOUNTER — Other Ambulatory Visit: Payer: Self-pay

## 2021-04-16 DIAGNOSIS — Z3A39 39 weeks gestation of pregnancy: Secondary | ICD-10-CM | POA: Diagnosis not present

## 2021-04-16 DIAGNOSIS — Z3A40 40 weeks gestation of pregnancy: Secondary | ICD-10-CM

## 2021-04-16 DIAGNOSIS — O26893 Other specified pregnancy related conditions, third trimester: Secondary | ICD-10-CM | POA: Diagnosis present

## 2021-04-16 DIAGNOSIS — Z20822 Contact with and (suspected) exposure to covid-19: Secondary | ICD-10-CM | POA: Diagnosis present

## 2021-04-16 DIAGNOSIS — O43123 Velamentous insertion of umbilical cord, third trimester: Secondary | ICD-10-CM | POA: Diagnosis present

## 2021-04-16 LAB — CBC
HCT: 37.2 % (ref 36.0–46.0)
Hemoglobin: 12.8 g/dL (ref 12.0–15.0)
MCH: 31.2 pg (ref 26.0–34.0)
MCHC: 34.4 g/dL (ref 30.0–36.0)
MCV: 90.7 fL (ref 80.0–100.0)
Platelets: 156 10*3/uL (ref 150–400)
RBC: 4.1 MIL/uL (ref 3.87–5.11)
RDW: 13.7 % (ref 11.5–15.5)
WBC: 6.6 10*3/uL (ref 4.0–10.5)
nRBC: 0 % (ref 0.0–0.2)

## 2021-04-16 LAB — TYPE AND SCREEN
ABO/RH(D): O POS
Antibody Screen: NEGATIVE

## 2021-04-16 LAB — RESP PANEL BY RT-PCR (FLU A&B, COVID) ARPGX2
Influenza A by PCR: NEGATIVE
Influenza B by PCR: NEGATIVE
SARS Coronavirus 2 by RT PCR: NEGATIVE

## 2021-04-16 LAB — RPR: RPR Ser Ql: NONREACTIVE

## 2021-04-16 MED ORDER — OXYCODONE-ACETAMINOPHEN 5-325 MG PO TABS: 1.0000 | ORAL_TABLET | ORAL | Status: DC | PRN

## 2021-04-16 MED ORDER — SOD CITRATE-CITRIC ACID 500-334 MG/5ML PO SOLN: 30.0000 mL | ORAL | Status: DC | PRN

## 2021-04-16 MED ORDER — METHYLERGONOVINE MALEATE 0.2 MG PO TABS: 0.2000 mg | ORAL_TABLET | ORAL | Status: DC | PRN

## 2021-04-16 MED ORDER — SIMETHICONE 80 MG PO CHEW: 80.0000 mg | CHEWABLE_TABLET | ORAL | Status: DC | PRN

## 2021-04-16 MED ORDER — IBUPROFEN 800 MG PO TABS
ORAL_TABLET | ORAL | Status: AC
  Filled 2021-04-16: qty 1

## 2021-04-16 MED ORDER — SENNOSIDES-DOCUSATE SODIUM 8.6-50 MG PO TABS
2.0000 | ORAL_TABLET | Freq: Every day | ORAL | Status: DC
  Administered 2021-04-17: 2 via ORAL
  Filled 2021-04-16: qty 2

## 2021-04-16 MED ORDER — FENTANYL CITRATE (PF) 100 MCG/2ML IJ SOLN: 50.0000 ug | INTRAMUSCULAR | Status: DC | PRN

## 2021-04-16 MED ORDER — METHYLERGONOVINE MALEATE 0.2 MG/ML IJ SOLN: 0.2000 mg | INTRAMUSCULAR | Status: DC | PRN

## 2021-04-16 MED ORDER — BENZOCAINE-MENTHOL 20-0.5 % EX AERO
1.0000 "application " | INHALATION_SPRAY | CUTANEOUS | Status: DC | PRN
  Administered 2021-04-16: 1 via TOPICAL
  Filled 2021-04-16: qty 56

## 2021-04-16 MED ORDER — SODIUM CHLORIDE 0.9% FLUSH
3.0000 mL | Freq: Two times a day (BID) | INTRAVENOUS | Status: DC
  Administered 2021-04-16: 3 mL via INTRAVENOUS

## 2021-04-16 MED ORDER — METHYLERGONOVINE MALEATE 0.2 MG/ML IJ SOLN
0.2000 mg | Freq: Once | INTRAMUSCULAR | Status: AC
  Administered 2021-04-16: 0.2 mg via INTRAMUSCULAR

## 2021-04-16 MED ORDER — TERBUTALINE SULFATE 1 MG/ML IJ SOLN: 0.2500 mg | Freq: Once | INTRAMUSCULAR | Status: DC | PRN

## 2021-04-16 MED ORDER — LACTATED RINGERS IV SOLN: 500.0000 mL | INTRAVENOUS | Status: DC | PRN

## 2021-04-16 MED ORDER — PRENATAL MULTIVITAMIN CH
1.0000 | ORAL_TABLET | Freq: Every day | ORAL | Status: DC
  Administered 2021-04-16 – 2021-04-17 (×2): 1 via ORAL
  Filled 2021-04-16 (×2): qty 1

## 2021-04-16 MED ORDER — MAGNESIUM HYDROXIDE 400 MG/5ML PO SUSP
30.0000 mL | ORAL | Status: DC | PRN
Start: 2021-04-16 — End: 2021-04-17

## 2021-04-16 MED ORDER — LACTATED RINGERS IV SOLN: INTRAVENOUS | Status: DC

## 2021-04-16 MED ORDER — LIDOCAINE HCL (PF) 1 % IJ SOLN: 30.0000 mL | INTRAMUSCULAR | Status: DC | PRN

## 2021-04-16 MED ORDER — OXYTOCIN BOLUS FROM INFUSION
333.0000 mL | Freq: Once | INTRAVENOUS | Status: AC
  Administered 2021-04-16: 333 mL via INTRAVENOUS

## 2021-04-16 MED ORDER — ZOLPIDEM TARTRATE 5 MG PO TABS: 5.0000 mg | ORAL_TABLET | Freq: Every evening | ORAL | Status: DC | PRN

## 2021-04-16 MED ORDER — MEASLES, MUMPS & RUBELLA VAC IJ SOLR: 0.5000 mL | Freq: Once | INTRAMUSCULAR | Status: DC

## 2021-04-16 MED ORDER — DIPHENHYDRAMINE HCL 25 MG PO CAPS: 25.0000 mg | ORAL_CAPSULE | Freq: Four times a day (QID) | ORAL | Status: DC | PRN

## 2021-04-16 MED ORDER — OXYCODONE-ACETAMINOPHEN 5-325 MG PO TABS: 2.0000 | ORAL_TABLET | ORAL | Status: DC | PRN

## 2021-04-16 MED ORDER — OXYTOCIN-SODIUM CHLORIDE 30-0.9 UT/500ML-% IV SOLN
2.5000 [IU]/h | INTRAVENOUS | Status: DC
  Administered 2021-04-16: 2.5 [IU]/h via INTRAVENOUS
  Filled 2021-04-16: qty 500

## 2021-04-16 MED ORDER — ACETAMINOPHEN 325 MG PO TABS: 650.0000 mg | ORAL_TABLET | ORAL | Status: DC | PRN

## 2021-04-16 MED ORDER — GUAIFENESIN ER 600 MG PO TB12
600.0000 mg | ORAL_TABLET | Freq: Two times a day (BID) | ORAL | Status: DC
  Administered 2021-04-16 – 2021-04-17 (×2): 600 mg via ORAL
  Filled 2021-04-16 (×3): qty 1

## 2021-04-16 MED ORDER — DIBUCAINE (PERIANAL) 1 % EX OINT: 1.0000 "application " | TOPICAL_OINTMENT | CUTANEOUS | Status: DC | PRN

## 2021-04-16 MED ORDER — ONDANSETRON HCL 4 MG PO TABS: 4.0000 mg | ORAL_TABLET | ORAL | Status: DC | PRN

## 2021-04-16 MED ORDER — MISOPROSTOL 25 MCG QUARTER TABLET: 25.0000 ug | ORAL_TABLET | ORAL | Status: DC | PRN

## 2021-04-16 MED ORDER — IBUPROFEN 800 MG PO TABS
800.0000 mg | ORAL_TABLET | Freq: Three times a day (TID) | ORAL | Status: DC
  Administered 2021-04-16 – 2021-04-17 (×5): 800 mg via ORAL
  Filled 2021-04-16 (×4): qty 1

## 2021-04-16 MED ORDER — ONDANSETRON HCL 4 MG/2ML IJ SOLN: 4.0000 mg | INTRAMUSCULAR | Status: DC | PRN

## 2021-04-16 MED ORDER — TETANUS-DIPHTH-ACELL PERTUSSIS 5-2.5-18.5 LF-MCG/0.5 IM SUSY: 0.5000 mL | PREFILLED_SYRINGE | Freq: Once | INTRAMUSCULAR | Status: DC

## 2021-04-16 MED ORDER — COCONUT OIL OIL
1.0000 "application " | TOPICAL_OIL | Status: DC | PRN
  Administered 2021-04-16: 1 via TOPICAL

## 2021-04-16 MED ORDER — SODIUM CHLORIDE 0.9% FLUSH: 3.0000 mL | INTRAVENOUS | Status: DC | PRN

## 2021-04-16 MED ORDER — WITCH HAZEL-GLYCERIN EX PADS: 1.0000 "application " | MEDICATED_PAD | CUTANEOUS | Status: DC | PRN

## 2021-04-16 MED ORDER — SODIUM CHLORIDE 0.9 % IV SOLN: 250.0000 mL | INTRAVENOUS | Status: DC | PRN

## 2021-04-16 MED ORDER — ONDANSETRON HCL 4 MG/2ML IJ SOLN: 4.0000 mg | Freq: Four times a day (QID) | INTRAMUSCULAR | Status: DC | PRN

## 2021-04-16 MED ORDER — FERROUS SULFATE 325 (65 FE) MG PO TABS
325.0000 mg | ORAL_TABLET | Freq: Two times a day (BID) | ORAL | Status: DC
  Administered 2021-04-16 – 2021-04-17 (×3): 325 mg via ORAL
  Filled 2021-04-16 (×3): qty 1

## 2021-04-16 NOTE — H&P (Signed)
26 y.o. [redacted]w[redacted]d  [id number] comes in c/o labor.  Otherwise has good fetal movement and no bleeding.  Past Medical History:  Diagnosis Date   Medical history non-contributory     Past Surgical History:  Procedure Laterality Date   DILATION AND CURETTAGE OF UTERUS N/A 02/04/2015   Procedure: DILATATION AND CURETTAGE;  Surgeon: Jonnie Kind, MD;  Location: [place] [facility];  Service: Gynecology;  Laterality: N/A;   PERINEAL LACERATION REPAIR  02/04/2015   Procedure: SUTURE REPAIR PERINEAL and CLITORAL LACERATIONS;  Surgeon: Jonnie Kind, MD;  Location: [facility];  Service: Gynecology;;    OB History  Gravida Para Term Preterm AB Living  4 2 2   1 2   SAB IAB Ectopic Multiple Live Births  1     0 2    # Outcome Date GA Lbr Len/2nd Weight Sex Delivery Anes PTL Lv  4 Current           3 Term 07/15/18 [redacted]w[redacted]d 07:15 / 00:16 3800 g M Vag-Spont Local  LIV     Birth Comments: none  2 Term 01/31/15 [redacted]w[redacted]d 12:13 / 00:25 3260 g F Vag-Spont Local  LIV     Birth Comments: none  1 SAB             Social History   Socioeconomic History   Marital status: Married    Spouse name: Not on file   Number of children: Not on file   Years of education: Not on file   Highest education level: Not on file  Occupational History   Occupation: CNA   Tobacco Use   Smoking status: Never   Smokeless tobacco: Never  Vaping Use   Vaping Use: Never used  Substance and Sexual Activity   Alcohol use: No   Drug use: No   Sexual activity: Yes  Other Topics Concern   Not on file  Social History Narrative   Not on file   Social Determinants of Health   Financial Resource Strain: Not on file  Food Insecurity: Not on file  Transportation Needs: Not on file  Physical Activity: Not on file  Stress: Not on file  Social Connections: Not on file  Intimate Partner Violence: Not on file   Patient has no known allergies.    Prenatal Transfer Tool  Maternal Diabetes: No Genetic Screening: Declined Maternal  Ultrasounds/Referrals: Normal, marginal cord insertion Fetal Ultrasounds or other Referrals:  None Maternal Substance Abuse:  No Significant Maternal Medications:  None Significant Maternal Lab Results: Group B Strep negative  Other PNC: uncomplicated.    Vitals:   04/15/21 2336 04/15/21 2355  BP: 126/77 125/71  Pulse: (!) 104 (!) 113  Resp: 20   Temp: 99 F (37.2 C)   TempSrc: Oral   SpO2:  100%  Weight: 99.1 kg   Height: 5\' 6"  (1.676 m)     Lungs/Cor:  NAD Abdomen:  soft, gravid Ex:  no cords, erythema SVE:  5/80/-2 FHTs:  130s, good STV, NST R; Cat 1 tracing. Toco:  q 3   A/P   Term labor.  GBS neg.  Daria Pastures

## 2021-04-17 LAB — CBC
HCT: 32.1 % — ABNORMAL LOW (ref 36.0–46.0)
Hemoglobin: 11 g/dL — ABNORMAL LOW (ref 12.0–15.0)
MCH: 31.2 pg (ref 26.0–34.0)
MCHC: 34.3 g/dL (ref 30.0–36.0)
MCV: 90.9 fL (ref 80.0–100.0)
Platelets: 152 10*3/uL (ref 150–400)
RBC: 3.53 MIL/uL — ABNORMAL LOW (ref 3.87–5.11)
RDW: 13.7 % (ref 11.5–15.5)
WBC: 8.3 10*3/uL (ref 4.0–10.5)
nRBC: 0 % (ref 0.0–0.2)

## 2021-04-17 MED ORDER — IBUPROFEN 600 MG PO TABS: 600.0000 mg | ORAL_TABLET | Freq: Four times a day (QID) | ORAL | 0 refills | Status: AC | PRN

## 2021-04-17 NOTE — Plan of Care (Signed)
Problem: Education: Goal: Knowledge of condition will improve Outcome: Completed/Met   Problem: Activity: Goal: Will verbalize the importance of balancing activity with adequate rest periods Outcome: Completed/Met Goal: Ability to tolerate increased activity will improve Outcome: Completed/Met   Problem: Life Cycle: Goal: Chance of risk for complications during the postpartum period will decrease Outcome: Completed/Met   Problem: Role Relationship: Goal: Ability to demonstrate positive interaction with newborn will improve Outcome: Completed/Met   Problem: Education: Goal: Knowledge of General Education information will improve Description: Including pain rating scale, medication(s)/side effects and non-pharmacologic comfort measures Outcome: Completed/Met   Problem: Clinical Measurements: Goal: Ability to maintain clinical measurements within normal limits will improve Outcome: Completed/Met Goal: Will remain free from infection Outcome: Completed/Met Goal: Diagnostic test results will improve Outcome: Completed/Met Goal: Respiratory complications will improve Outcome: Completed/Met Goal: Cardiovascular complication will be avoided Outcome: Completed/Met   Problem: Activity: Goal: Risk for activity intolerance will decrease Outcome: Completed/Met   Problem: Nutrition: Goal: Adequate nutrition will be maintained Outcome: Completed/Met   Problem: Coping: Goal: Level of anxiety will decrease Outcome: Completed/Met   Problem: Elimination: Goal: Will not experience complications related to urinary retention Outcome: Completed/Met   Problem: Pain Managment: Goal: General experience of comfort will improve Outcome: Completed/Met   Problem: Safety: Goal: Ability to remain free from injury will improve Outcome: Completed/Met   Problem: Skin Integrity: Goal: Risk for impaired skin integrity will decrease Outcome: Completed/Met

## 2021-04-17 NOTE — Discharge Summary (Signed)
Postpartum Discharge Summary       Patient Name: Wanda Matthews DOB: April 18, 1995 MRN: [id number]  Date of admission: 04/15/2021 Delivery date:04/16/2021  Delivering provider: Carrington Clamp  Date of discharge: 04/17/2021  Admitting diagnosis: [redacted] weeks gestation of pregnancy [Z3A.40] Intrauterine pregnancy: [redacted]w[redacted]d     Secondary diagnosis:  Principal Problem:   [redacted] weeks gestation of pregnancy     Discharge diagnosis: Term Pregnancy Delivered                                              Post partum procedures: NA Augmentation: AROM Complications: None  Hospital course: Onset of Labor With Vaginal Delivery      26 y.o. yo [id number] at [redacted]w[redacted]d was admitted in Active Labor on 04/15/2021. Patient had an uncomplicated labor course as follows:  Membrane Rupture Time/Date: 4:37 AM ,04/16/2021   Delivery Method:Vaginal, Spontaneous  Episiotomy: None  Lacerations:  None  Patient had an uncomplicated postpartum course.  She is ambulating, tolerating a regular diet, passing flatus, and urinating well. Patient is discharged home in stable condition on 04/17/21.  Newborn Data: Birth date:04/16/2021  Birth time:5:41 AM  Gender:Female  Living status:Living  Apgars:9 ,9  Weight:3120 g   Magnesium Sulfate received: No BMZ received: No Rhophylac:N/A   Physical exam  Vitals:   04/16/21 1329 04/16/21 1730 04/16/21 1952 04/17/21 0500  BP: 124/81 105/73 121/70 114/78  Pulse: 84 82 74 72  Resp: 18 18 18 18   Temp: 98.6 F (37 C) 98.9 F (37.2 C) 98 F (36.7 C) 97.6 F (36.4 C)  TempSrc: Oral Oral Oral Oral  SpO2: 100% 100%    Weight:      Height:       General: alert, cooperative, and no distress Lochia: appropriate Uterine Fundus: firm DVT Evaluation: No evidence of DVT seen on physical exam. Labs: Lab Results  Component Value Date   WBC 8.3 04/17/2021   HGB 11.0 (L) 04/17/2021   HCT 32.1 (L) 04/17/2021   MCV 90.9 04/17/2021   PLT 152 04/17/2021   CMP Latest Ref Rng &  Units 06/27/2016  Glucose 65 - 99 mg/dL 82  BUN 6 - 20 mg/dL 10  Creatinine 06/29/2016 - 2.95 mg/dL 2.84  Sodium 1.32 - 440 mmol/L 140  Potassium 3.5 - 5.1 mmol/L 3.7  Chloride 101 - 111 mmol/L 106  CO2 22 - 32 mmol/L -  Calcium 8.9 - 10.3 mg/dL -  Total Protein 6.5 - 8.1 g/dL 6.7  Total Bilirubin 0.3 - 1.2 mg/dL 0.3  Alkaline Phos 38 - 126 U/L 116  AST 15 - 41 U/L 16  ALT 14 - 54 U/L 13(L)   Edinburgh Score: Edinburgh Postnatal Depression Scale Screening Tool 04/16/2021  I have been able to laugh and see the funny side of things. 0  I have looked forward with enjoyment to things. 0  I have blamed myself unnecessarily when things went wrong. 0  I have been anxious or worried for no good reason. 0  I have felt scared or panicky for no good reason. 0  Things have been getting on top of me. 0  I have been so unhappy that I have had difficulty sleeping. 0  I have felt sad or miserable. 0  I have been so unhappy that I have been crying. 0  The thought of harming myself has occurred to  me. 0  Edinburgh Postnatal Depression Scale Total 0      After visit meds:  Allergies as of 04/17/2021   No Known Allergies      Medication List     STOP taking these medications    guaiFENesin 100 MG/5ML liquid Commonly known as: ROBITUSSIN   PRENATAL VITAMINS PO       TAKE these medications    ibuprofen 600 MG tablet Commonly known as: ADVIL Take 1 tablet (600 mg total) by mouth every 6 (six) hours as needed.               Discharge Care Instructions  (From admission, onward)           Start     Ordered   04/17/21 0000  Discharge wound care:       Comments: For a cesarean delivery: You may wash incision with soap and water.  Do not soak or submerge the incision for 2 weeks. Keep incision dry. You may need to keep a sanitary pad or panty liner between the incision and your clothing for comfort and to keep the incision dry. If you note drainage, increased pain, or increased  redness of the incision, then please notify your physician.   04/17/21 0942   04/17/21 0000  If the dressing is still on your incision site when you go home, remove it on the third day after your surgery date. Remove dressing if it begins to fall off, or if it is dirty or damaged before the third day.       Comments: For a cesarean delivery   04/17/21 0942             Discharge home in stable condition Infant Feeding: Bottle Infant Disposition:home with mother Discharge instruction: per After Visit Summary and Postpartum booklet. Activity: Advance as tolerated. Pelvic rest for 6 weeks.  Diet: routine diet Anticipated Birth Control: Unsure Postpartum Appointment:4 weeks Future Appointments:No future appointments. Follow up Visit:      04/17/2021 Waynard Reeds, MD

## 2021-04-27 ENCOUNTER — Inpatient Hospital Stay (HOSPITAL_COMMUNITY): Admission: AD | Admit: 2021-04-27 | Payer: Self-pay | Source: Home / Self Care | Admitting: Obstetrics and Gynecology

## 2021-04-27 ENCOUNTER — Telehealth (HOSPITAL_COMMUNITY): Payer: Self-pay | Admitting: *Deleted

## 2021-04-27 ENCOUNTER — Inpatient Hospital Stay (HOSPITAL_COMMUNITY): Payer: Self-pay

## 2021-04-27 NOTE — Telephone Encounter (Signed)
Attempted hospital discharge follow-up call. Left message for patient to return RN call. Deforest Hoyles, RN, 04/27/21, [phone]

## 2021-06-02 ENCOUNTER — Emergency Department (HOSPITAL_COMMUNITY): Payer: Self-pay

## 2021-06-02 ENCOUNTER — Emergency Department (HOSPITAL_COMMUNITY)
Admission: EM | Admit: 2021-06-02 | Discharge: 2021-06-02 | Disposition: A | Discharge status: Home / Self Care | Payer: Self-pay | Attending: Emergency Medicine | Admitting: Emergency Medicine

## 2021-06-02 ENCOUNTER — Other Ambulatory Visit: Payer: Self-pay

## 2021-06-02 ENCOUNTER — Encounter (HOSPITAL_COMMUNITY): Payer: Self-pay

## 2021-06-02 DIAGNOSIS — R000 Tachycardia, unspecified: Secondary | ICD-10-CM | POA: Insufficient documentation

## 2021-06-02 DIAGNOSIS — B349 Viral infection, unspecified: Secondary | ICD-10-CM | POA: Insufficient documentation

## 2021-06-02 DIAGNOSIS — Z20822 Contact with and (suspected) exposure to covid-19: Secondary | ICD-10-CM | POA: Insufficient documentation

## 2021-06-02 DIAGNOSIS — R079 Chest pain, unspecified: Secondary | ICD-10-CM | POA: Insufficient documentation

## 2021-06-02 LAB — I-STAT BETA HCG BLOOD, ED (MC, WL, AP ONLY): I-stat hCG, quantitative: <5 m[IU]/mL (ref <5)

## 2021-06-02 LAB — BASIC METABOLIC PANEL
Anion gap: 9 (ref 5–15)
BUN: 15 mg/dL (ref 6–20)
CO2: 23 mmol/L (ref 22–32)
Calcium: 9 mg/dL (ref 8.9–10.3)
Chloride: 103 mmol/L (ref 98–111)
Creatinine, Ser: 1.07 mg/dL — ABNORMAL HIGH (ref 0.44–1.00)
GFR, Estimated: >60 mL/min (ref >60)
Glucose, Bld: 127 mg/dL — ABNORMAL HIGH (ref 70–99)
Potassium: 3.7 mmol/L (ref 3.5–5.1)
Sodium: 135 mmol/L (ref 135–145)

## 2021-06-02 LAB — TROPONIN I (HIGH SENSITIVITY): Troponin I (High Sensitivity): 5 ng/L (ref <18)

## 2021-06-02 LAB — CBC
HCT: 36.7 % (ref 36.0–46.0)
Hemoglobin: 12.4 g/dL (ref 12.0–15.0)
MCH: 30.8 pg (ref 26.0–34.0)
MCHC: 33.8 g/dL (ref 30.0–36.0)
MCV: 91.1 fL (ref 80.0–100.0)
Platelets: 194 10*3/uL (ref 150–400)
RBC: 4.03 MIL/uL (ref 3.87–5.11)
RDW: 12.9 % (ref 11.5–15.5)
WBC: 10.6 10*3/uL — ABNORMAL HIGH (ref 4.0–10.5)
nRBC: 0 % (ref 0.0–0.2)

## 2021-06-02 LAB — D-DIMER, QUANTITATIVE: D-Dimer, Quant: 0.44 ug/mL-FEU (ref 0.00–0.50)

## 2021-06-02 LAB — RESP PANEL BY RT-PCR (FLU A&B, COVID) ARPGX2
Influenza A by PCR: NEGATIVE
Influenza B by PCR: NEGATIVE
SARS Coronavirus 2 by RT PCR: NEGATIVE

## 2021-06-02 MED ORDER — NAPROXEN 500 MG PO TABS: 500.0000 mg | ORAL_TABLET | Freq: Two times a day (BID) | ORAL | 0 refills | Status: AC

## 2021-06-02 NOTE — ED Provider Notes (Signed)
?[facility] ?[facility] ?Provider Note ?MRN:  [id number]  ?Arrival date & time: 06/02/21    ? ?Chief Complaint   ?Chest Pain ?  ?History of Present Illness   ?Wanda Matthews is a 26 y.o. year-old female with no pertinent past medical presenting to the ED with chief complaint of chest pain. ? ?Right-sided chest pain worse with deep breaths, radiates to the thoracic back on the right.  Vaginal delivery about 1 month ago.  Denies leg pain or swelling, no abdominal pain, no vaginal bleeding or discharge.  Also with fever. ? ?Review of Systems  ?A thorough review of systems was obtained and all systems are negative except as noted in the HPI and PMH.  ? ?Patient's Health History   ? ?Past Medical History:  ?Diagnosis Date  ? Medical history non-contributory   ?  ?Past Surgical History:  ?Procedure Laterality Date  ? DILATION AND CURETTAGE OF UTERUS N/A 02/04/2015  ? Procedure: DILATATION AND CURETTAGE;  Surgeon: Tilda Burrow, MD;  Location: [facility];  Service: Gynecology;  Laterality: N/A;  ? PERINEAL LACERATION REPAIR  02/04/2015  ? Procedure: SUTURE REPAIR PERINEAL and CLITORAL LACERATIONS;  Surgeon: Tilda Burrow, MD;  Location: [facility];  Service: Gynecology;;  ?  ?History reviewed. No pertinent family history.  ?Social History  ? ?Socioeconomic History  ? Marital status: Married  ?  Spouse name: Not on file  ? Number of children: Not on file  ? Years of education: Not on file  ? Highest education level: Not on file  ?Occupational History  ? Occupation: CNA   ?Tobacco Use  ? Smoking status: Never  ? Smokeless tobacco: Never  ?Vaping Use  ? Vaping Use: Never used  ?Substance and Sexual Activity  ? Alcohol use: No  ? Drug use: No  ? Sexual activity: Yes  ?Other Topics Concern  ? Not on file  ?Social History Narrative  ? Not on file  ? ?Social Determinants of Health  ? ?Financial Resource Strain: Not on file  ?Food Insecurity: Not on file  ?Transportation Needs: Not on file   ?Physical Activity: Not on file  ?Stress: Not on file  ?Social Connections: Not on file  ?Intimate Partner Violence: Not on file  ?  ? ?Physical Exam  ? ?Vitals:  ? 06/02/21 0222 06/02/21 0400  ?BP: 112/63 103/62  ?Pulse: (!) 139 95  ?Resp: 19 (!) 24  ?Temp: (!) 101.1 ?F (38.4 ?C)   ?SpO2: 97% 99%  ?  ?CONSTITUTIONAL: Well-appearing, NAD ?NEURO/PSYCH:  Alert and oriented x 3, no focal deficits ?EYES:  eyes equal and reactive ?ENT/NECK:  no LAD, no JVD ?CARDIO: Tachycardic rate, well-perfused, normal S1 and S2 ?PULM:  CTAB no wheezing or rhonchi ?GI/GU:  non-distended, non-tender ?MSK/SPINE:  No gross deformities, no edema ?SKIN:  no rash, atraumatic ? ? ?*Additional and/or pertinent findings included in MDM below ? ?Diagnostic and Interventional Summary  ? ? EKG Interpretation ? ?Date/Time:  Wednesday June 02 2021 02:12:33 EDT ?Ventricular Rate:  129 ?PR Interval:  126 ?QRS Duration: 66 ?QT Interval:  268 ?QTC Calculation: 392 ?R Axis:   61 ?Text Interpretation: Sinus tachycardia Right atrial enlargement Nonspecific ST and T wave abnormality Abnormal ECG No previous ECGs available Confirmed by Kennis Carina [phone]) on 06/02/2021 4:26:22 AM ?  ? ?  ? ?Labs Reviewed  ?BASIC METABOLIC PANEL - Abnormal; Notable for the following components:  ?    Result Value  ? Glucose, Bld 127 (*)   ?  Creatinine, Ser 1.07 (*)   ? All other components within normal limits  ?CBC - Abnormal; Notable for the following components:  ? WBC 10.6 (*)   ? All other components within normal limits  ?RESP PANEL BY RT-PCR (FLU A&B, COVID) [id number]  ?D-DIMER, QUANTITATIVE  ?I-STAT BETA HCG BLOOD, ED ([facility], [facility], [facility] ONLY)  ?TROPONIN I (HIGH SENSITIVITY)  ?  ?DG Chest 2 View  ?Final Result  ?  ?  ?Medications - No data to display  ? ?Procedures  /  Critical Care ?Procedures ? ?ED Course and Medical Decision Making  ?Initial Impression and Ddx ?DDx includes viral illness, PE, pneumonia, myocarditis.  Awaiting labs, troponin, D-dimer. ? ?Past  medical/surgical history that increases complexity of ED encounter: Recent vaginal delivery 1 month ago on ? ?Interpretation of Diagnostics ?I personally reviewed the EKG and my interpretation is as follows: Nonspecific ST findings, no prior for comparison, sinus rhythm ?   ?Chest x-ray is without acute process, labs reveal no significant blood count or electrolyte disturbance, troponin is negative, D-dimer is negative ? ?Patient Reassessment and Ultimate Disposition/Management ?Emergent process largely excluded with negative work-up, patient feeling better, does have a sick contact at home and so favoring viral illness.  Appropriate for discharge. ? ?Patient management required discussion with the following services or consulting groups:  None ? ?Complexity of Problems Addressed ?Acute illness or injury that poses threat of life of bodily function ? ?Additional Data Reviewed and Analyzed ?Further history obtained from: ?Further history from spouse/family member ? ?Additional Factors Impacting ED Encounter Risk ?Prescriptions ? ?Elmer Sow. Pilar Plate, MD ?[facility] Emergency Medicine ?[facility] [email or web address] ? ?Final Clinical Impressions(s) / ED Diagnoses  ? ?  ICD-10-CM   ?1. Chest pain, unspecified type  R07.9   ?  ?2. Viral illness  B34.9   ?  ?  ?ED Discharge Orders   ? ?      Ordered  ?  naproxen (NAPROSYN) 500 MG tablet  2 times daily       ? 06/02/21 0528  ? ?  ?  ? ?  ?  ? ?Discharge Instructions Discussed with and Provided to Patient:  ? ? ? ?Discharge Instructions   ? ?  ?You were evaluated in the Emergency Department and after careful evaluation, we did not find any emergent condition requiring admission or further testing in the hospital. ? ?Your exam/testing today is overall reassuring.  Symptoms likely due to a viral illness.  Recommend Tylenol 1000 mg every 4-6 hours as needed for discomfort.  You can also use the Naprosyn anti-inflammatory prescribed. ? ?Please return to the  Emergency Department if you experience any worsening of your condition.   Thank you for allowing [place] to be a part of your care. ? ? ? ? ?  ?Sabas Sous, MD ?06/02/21 0530 ? ?

## 2021-06-02 NOTE — ED Triage Notes (Signed)
Pt has been getting over a cold. Last night at 7 the rt side of her chest started hurting and she became short of breath. Pain has not gone away ?

## 2021-06-02 NOTE — Discharge Instructions (Signed)
You were evaluated in the Emergency Department and after careful evaluation, we did not find any emergent condition requiring admission or further testing in the hospital. ? ?Your exam/testing today is overall reassuring.  Symptoms likely due to a viral illness.  Recommend Tylenol 1000 mg every 4-6 hours as needed for discomfort.  You can also use the Naprosyn anti-inflammatory prescribed. ? ?Please return to the Emergency Department if you experience any worsening of your condition.   Thank you for allowing [place] to be a part of your care. ?

## 2024-02-28 IMAGING — CR DG CHEST 2V
2 series · 2 of 2 positions shown · non-contrast
Comparison: None.

CLINICAL DATA: Chest pain.

EXAM:
CHEST - 2 VIEW

[chest pa]
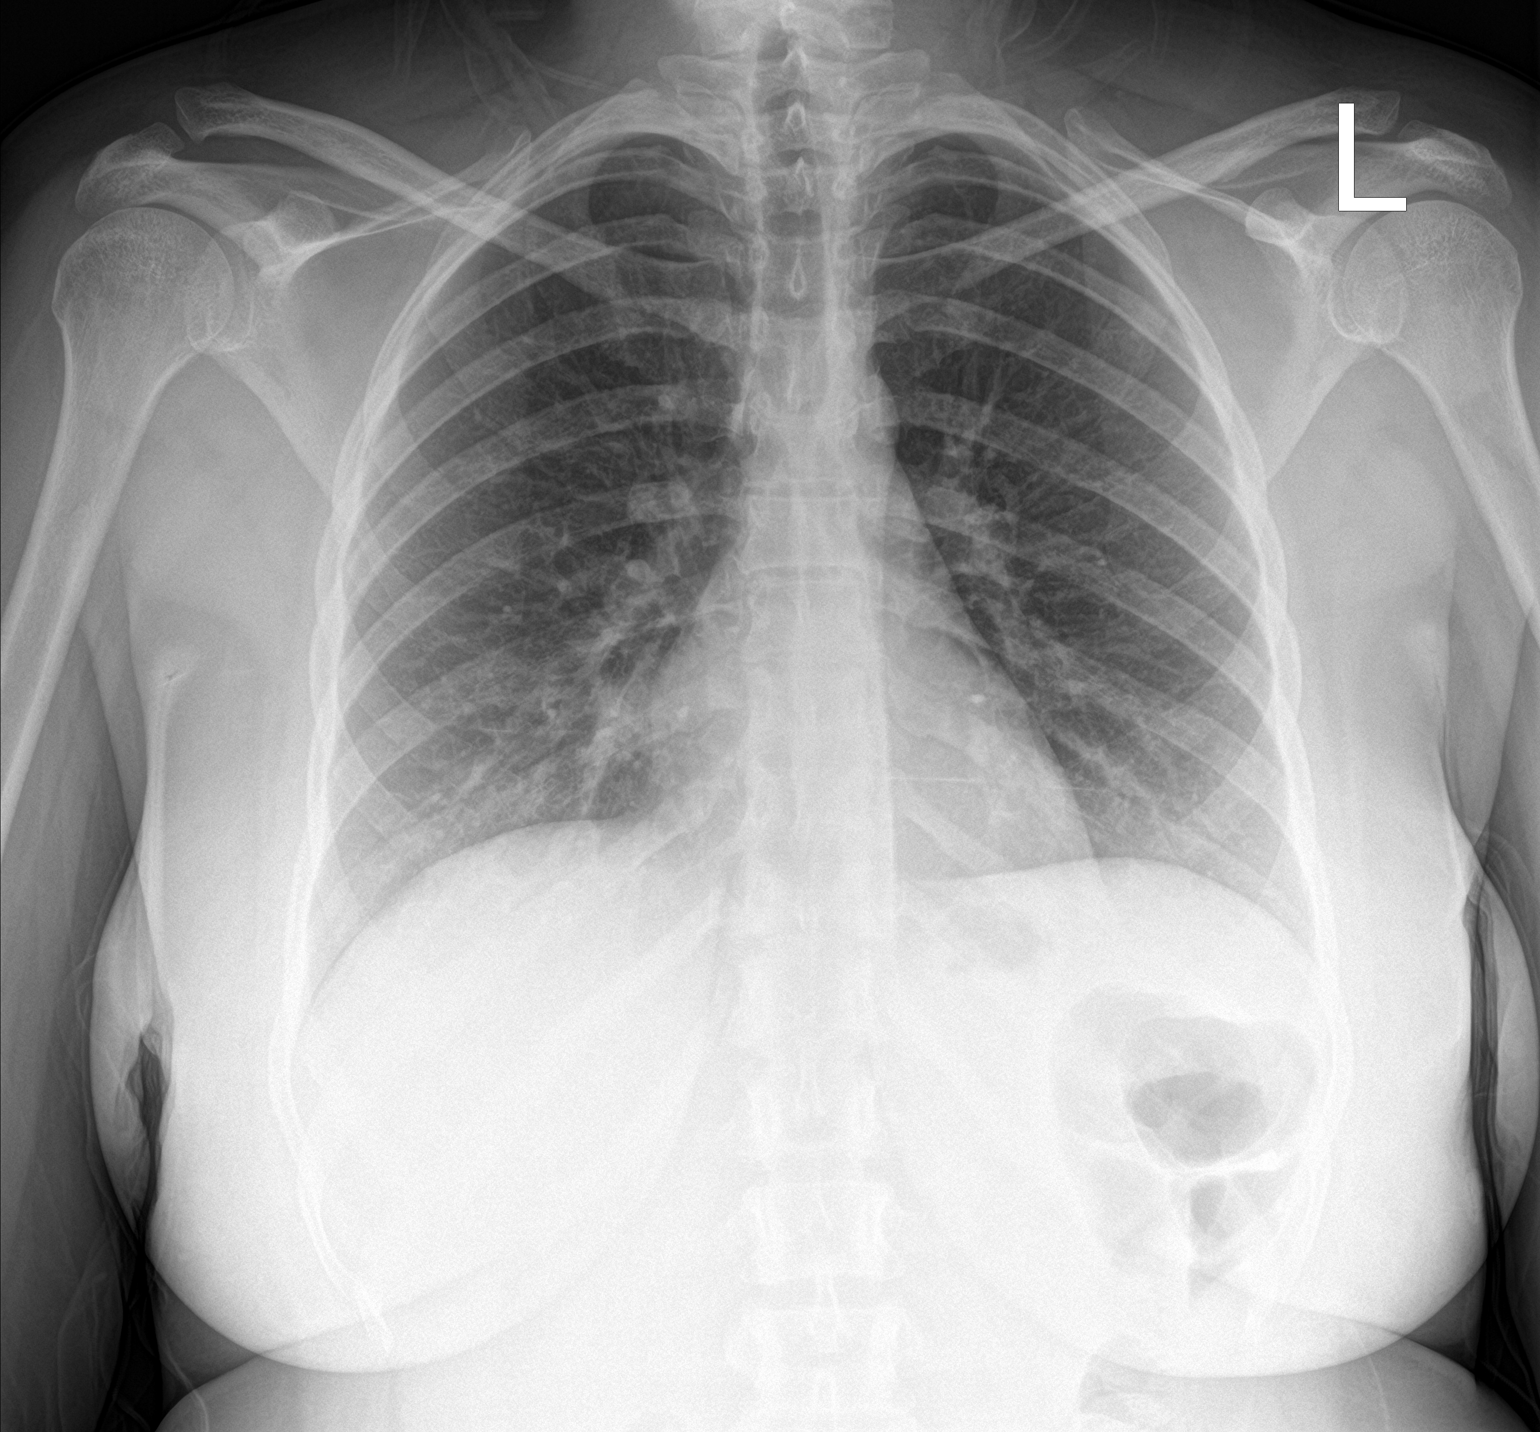

[chest lat]
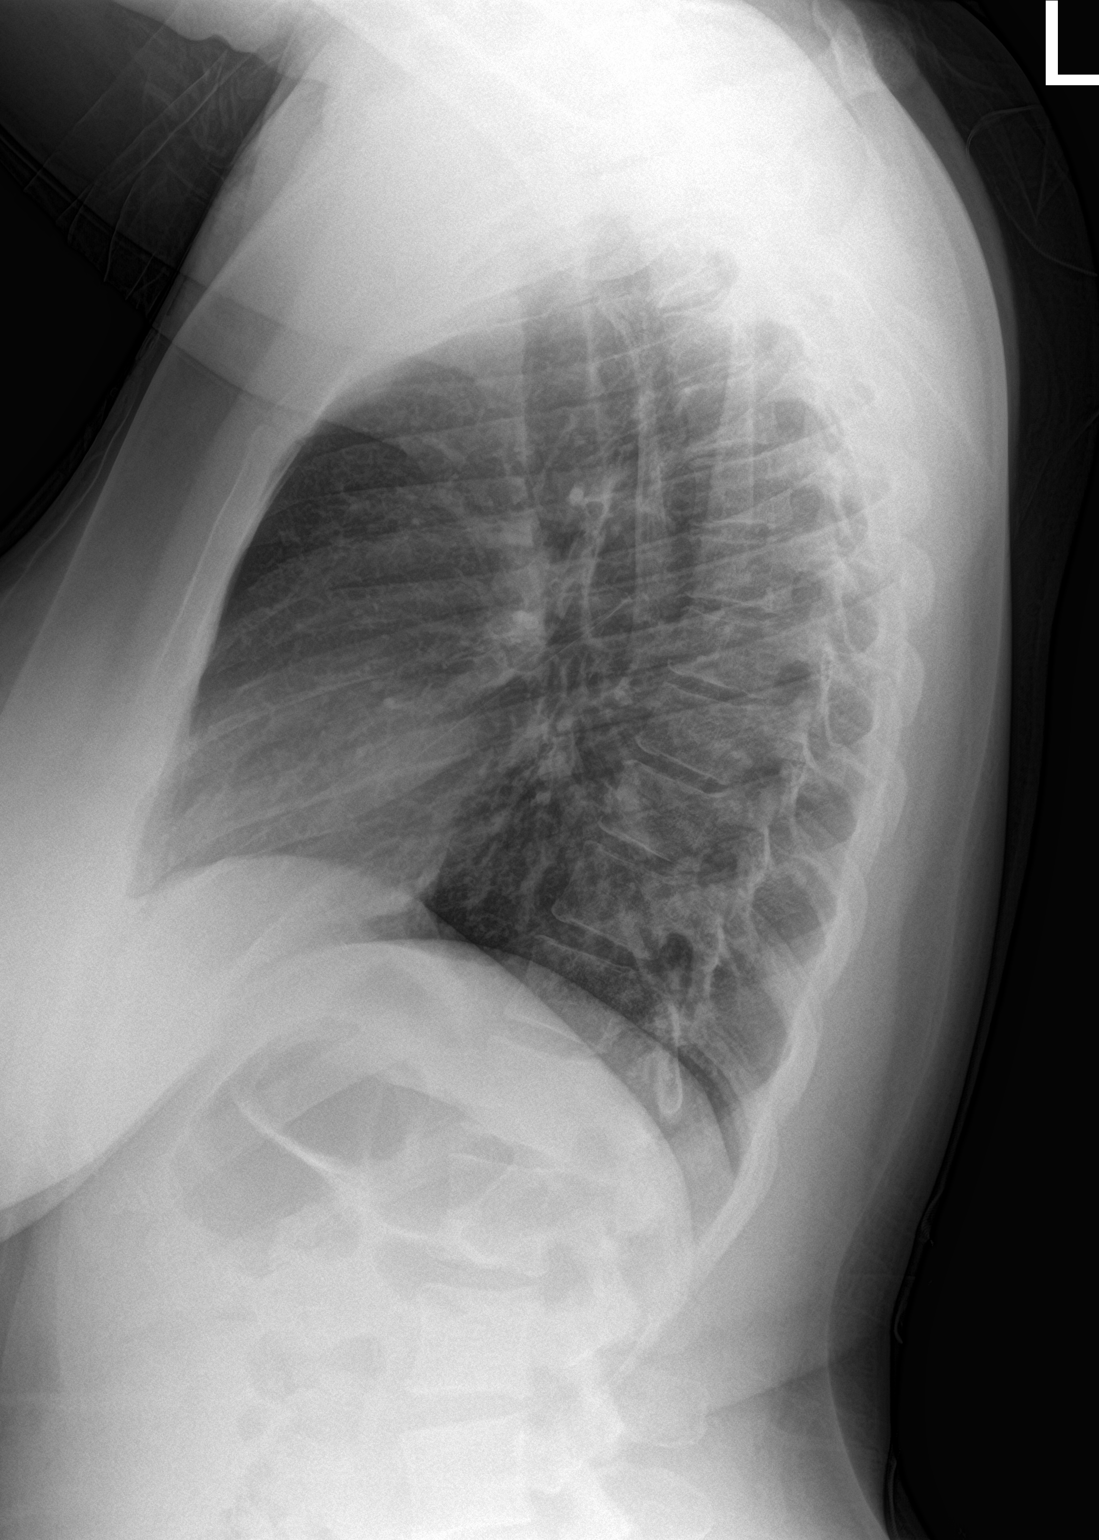

[2 of 2 positions shown; findings below may reference images not displayed]

FINDINGS: The heart size and mediastinal contours are within normal limits.
Both lungs are clear. The visualized skeletal structures are
unremarkable.
IMPRESSION: No active cardiopulmonary disease.
